# Patient Record
Sex: Male | Born: 1980 | Race: White | Hispanic: Yes | Marital: Single | Smoking: Never smoker
Health system: Southern US, Community
[De-identification: ages and names within clinical notes are randomized; demographics above are authoritative.]

## PROBLEM LIST (undated history)

## (undated) DIAGNOSIS — S82873B Displaced pilon fracture of unspecified tibia, initial encounter for open fracture type I or II: Secondary | ICD-10-CM

---

## 2006-07-28 ENCOUNTER — Emergency Department (HOSPITAL_COMMUNITY): Admission: EM | Admit: 2006-07-28 | Discharge: 2006-07-29 | Payer: Self-pay | Admitting: Emergency Medicine

## 2007-07-01 ENCOUNTER — Emergency Department (HOSPITAL_COMMUNITY): Admission: EM | Admit: 2007-07-01 | Discharge: 2007-07-01 | Payer: Self-pay | Admitting: Emergency Medicine

## 2010-07-04 NOTE — Op Note (Signed)
NAMEGERAD, Jermaine Morgan         ACCOUNT NO.:  [id number]   MEDICAL RECORD NO.:  [id number]          PATIENT TYPE:  EMS   LOCATION:  MINO                         FACILITY:  [facility]   PHYSICIAN:  Dionne Ano. Gramig III, M.D.DATE OF BIRTH:  03-Feb-1981   DATE OF PROCEDURE:  DATE OF DISCHARGE:                               OPERATIVE REPORT   PREOPERATIVE DIAGNOSES:  1. Traumatic amputation, right middle finger secondary to lawn mower      injury.  2. Ring finger laceration.   POSTOPERATIVE DIAGNOSES:  1. Traumatic amputation, right middle finger secondary lawn mower      injury.  2. Ring finger laceration.   PROCEDURE:  1. Incision and drainage, skin and subcutaneous tissue, right ring      finger secondary to lawn mower injury.  2. Incision and drainage,  right middle finger open amputation.  3. Nail plate removal, right middle finger.  4. Nail bed repair, right middle finger.  5. Volar-flap advancement, right middle finger with open treatment of      distal phalanx fracture.   SURGEON:  Dionne Ano. Amanda Pea, M.D.   ASSISTANT:  Karie Chimera, PA-C   TOURNIQUET TIME:  Zero.   ESTIMATED BLOOD LOSS:  Less than 20 mL.   ANESTHESIA:  Intermetacarpal block.   INDICATIONS FOR THE PROCEDURE:  This patient's history is well detailed.  He understands risks and benefits and desires to proceed with surgical  intervention.   OPERATIVE PROCEDURE:  The patient was seen by myself and ER staff at  course.  He was taken to the procedural area and operative arena was  secured.  I performed a thorough prep and drape with Betadine scrub and  paint.  Sensorcaine block was used for anesthesia and formed  intermetacarpal block.  Following this, the patient underwent I and D of  the ring finger.  This was an excisional debridement of skin and  subcutaneous tissue.  This was treated with aggressive I and D.  Bone  was not encroached upon and was stable.   Following this, I and D of skin,  subcutaneous tissue, bone, nail plate,  nail bed was accomplished.   Following this, the patient then underwent nail plate removal.   Following this, the volar flap underwent I and D.  Next, open treatment  of the distal phalanx was accomplished with a rongeur and reassembling  of the bony fragments.  The patient tolerated this well.  Following open  treatment and nail plate removal, I then completed the excisional  debridement and then performed a volar flap advancement.  Skin edges  were cut appropriately and sculpted.  The flap advanced nicely over the  defect in question and was then sewed with combination of 5-0 and 6-0  chromic suture.  He had refitted to the tip with no complicating  features.  Adaptic was placed on the eponychium fold.  I did perform a  complex nail bed repair utilizing the flap against the nail bed tissue.   Once this done, Adaptic was placed in the eponychium fold.  Nonadherent  dressing was then placed followed by finger splint and splinting.  The  patient tolerated this well.  He would be discharged home on Keflex 5 mg  one p.o. q.i.d. x10 days given the open nature of his fracture and the  amputation as well as Vicodin p.r.n. pain, elevation, keeping the area  clean and dry, and RTC to the office in 10 days.  I have discussed him  relevant do's and do not's, etc. and all questions have been encouraged  and answered.  It was a pleasure to see Jermaine Morgan today.  All  questions have been encouraged and answered.           ______________________________  Dionne Ano. Everlene Other, M.D.     Nash Mantis  D:  07/01/2007  T:  07/02/2007  Job:  [id number]

## 2010-07-04 NOTE — H&P (Signed)
Jermaine Morgan, Jermaine Morgan         ACCOUNT NO.:  [id number]   MEDICAL RECORD NO.:  [id number]          PATIENT TYPE:  EMS   LOCATION:  MINO                         FACILITY:  [facility]   PHYSICIAN:  Dionne Ano. Gramig III, M.D.DATE OF BIRTH:  04/24/1980   DATE OF ADMISSION:  07/01/2007  DATE OF DISCHARGE:                              HISTORY & PHYSICAL   HISTORY OF PRESENT ILLNESS:  Rosser is a 30 year old Hispanic male who  speaks minimal English, who presents after sticking his hand under a  [employer]vey[employer], mining. The patient sustained a traumatic amputation and will be  seen in the emergency room and stabilized.  I was asked to see him and  take over his care in regards to his upper extremity.  At the present  time, he does have pain in the finger.  He has no history of prior  injury to the area.  He is here today with his wife, who is pregnant  with their second child.  The patient now discussed all issues in  detail.   PAST MEDICAL HISTORY:  None.   PAST SURGICAL HISTORY:  None.   MEDICATIONS:  None.   ALLERGIES:  None.   SOCIAL HISTORY:  He is married.  He does not smoke or drink.   On examination, he is a pleasant male, alert and oriented, in no acute  distress, right-hand dominant.  He has a right middle finger DIP level  amputation.  There is no evidence of obvious infection.  There is dirty  contamination in the area.  The ring finger has a small skin injury  involving the subcu, but not frank exposed bone.  The middle finger  unfortunately does have exposed bone at the level of the amputation  site.   X-rays reviewed, which showed the amputation about the distal phalanx.  This does not encroached into the interphalangeal region.   IMPRESSION:  1. Open traumatic amputation with distal phalanx fracture, status post      lawn mower injury of right middle finger.  2. Ring finger laceration soft tissue in nature.   PLAN:  I have discussed with him his findings and we will prepare  for  surgical intervention.  He understands risks and benefits of bleeding,  infection, anesthesia, damage to normal structures, and failure of the  surgery to accomplish, its intended goals of relieving symptoms and  restoring function.  With all issues in mind, we will proceed  accordingly.           ______________________________  Dionne Ano. Everlene Other, M.D.    Nash Mantis  D:  07/01/2007  T:  07/02/2007  Job:  [id number]

## 2010-07-04 NOTE — Consult Note (Signed)
NAME:  Jermaine Morgan, Jermaine Morgan NO.:  [id number]   MEDICAL RECORD NO.:  [id number]          PATIENT TYPE:  EMS   LOCATION:  ED                           FACILITY:  [facility]   PHYSICIAN:  Sandria Bales. Ezzard Standing, M.D.  DATE OF BIRTH:  [birth year]   DATE OF CONSULTATION:  07/29/2006  DATE OF DISCHARGE:                                 CONSULTATION   REASON FOR CONSULTATION:  Perirectal abscess.   HISTORY OF ILLNESS:  This is a 30 year old [place] male who speaks  minimal English, who has presented with a history of  pimples around  his anus.  He said it has been going on for 2 or 3 weeks and I was  called at about 1 a.m. on Monday morning, June 9, by Dr. Weldon Inches for  evaluation of his perianal/perirectal abscess.   He has had no prior incision and drainage.  I used the telephone  interpreter during the entire encounter, both for the history and during  the procedure.   PAST MEDICAL HISTORY:  He denies a history of pulmonary disease, cardiac  disease, gastrointestinal disease, or urologic disease.   He works [employer] representative and, again, is from [place].   PHYSICAL EXAMINATION:  VITAL SIGNS:  His temperature is 98.1, blood  pressure 139/81, pulse is 74, respirations 20.  GENERAL:  He is a well-nourished Hispanic male.  LUNGS:  Clear to auscultation.  CARDIAC:  His heart has a regular rate and rhythm without murmur or rub.  ABDOMEN:  Soft.  RECTAL:  He has a raised area on his left lateral rectum, which is about  3-4 cm long and about 1.5 cm wide, consistent with a perianal abscess.   IMPRESSION:  Perianal abscess.   We will plan an incision and drainage in the emergency room.  I  discussed this with the patient via the translator.      Sandria Bales. Ezzard Standing, M.D.  Electronically Signed     DHN/MEDQ  D:  07/29/2006  T:  07/29/2006  Job:  [id number]

## 2010-07-04 NOTE — Op Note (Signed)
NAME:  Jermaine Morgan, Jermaine Morgan NO.:  [id number]   MEDICAL RECORD NO.:  [id number]          PATIENT TYPE:  EMS   LOCATION:  ED                           FACILITY:  [facility]   PHYSICIAN:  Sandria Bales. Ezzard Standing, M.D.  DATE OF BIRTH:  11-16-1980   DATE OF PROCEDURE:  07/29/2006  DATE OF DISCHARGE:                               OPERATIVE REPORT   PREOPERATIVE DIAGNOSIS:  Left lateral perineal abscess.   POSTOPERATIVE DIAGNOSIS:  Left lateral perineal abscess.   PROCEDURE:  Incision and drainage of perineal abscess.   SURGEON:  Sandria Bales. Ezzard Standing, M.D.   ANESTHESIA:  Approximately 8 mL  of 1% Xylocaine.   COMPLICATIONS:  None.   INDICATIONS FOR PROCEDURE:  [name] is a 30 year old [place] male who  has had a two to three week history of pimples around his anus, and  comes to the emergency room with increasing perianal pain.  While in the  emergency room, I got the translator phone to get a history from him;  and then had the translator phone help him during this procedure.   DESCRIPTION OF PROCEDURE:  The perianal area was prepped with Betadine  solution, infiltrated with 8 mL of 1% Xylocaine.  I then made a cruciate  incision into this abscess, drained 2 or 3 mL of pus.   I then packed it with gauze and left instructions with him to soak in a  bathtub three times a day.  He can return to work in a day or two.  He  is to see me back in 10 to 14 days for followup; knows to call me if any  problem.  He was afebrile. There was localized inflammation, but  certainly nothing widespread.  I do not think that he needs to be on  antibiotics at this time.      Sandria Bales. Ezzard Standing, M.D.  Electronically Signed     DHN/MEDQ  D:  07/29/2006  T:  07/29/2006  Job:  [id number]

## 2011-02-23 ENCOUNTER — Encounter: Payer: Self-pay | Admitting: Emergency Medicine

## 2011-02-23 ENCOUNTER — Emergency Department (HOSPITAL_COMMUNITY)
Admission: EM | Admit: 2011-02-23 | Discharge: 2011-02-24 | Disposition: A | Discharge status: Home / Self Care | Payer: Self-pay | Attending: Emergency Medicine | Admitting: Emergency Medicine

## 2011-02-23 DIAGNOSIS — R51 Headache: Secondary | ICD-10-CM | POA: Insufficient documentation

## 2011-02-23 DIAGNOSIS — M2550 Pain in unspecified joint: Secondary | ICD-10-CM | POA: Insufficient documentation

## 2011-02-23 DIAGNOSIS — R232 Flushing: Secondary | ICD-10-CM | POA: Insufficient documentation

## 2011-02-23 DIAGNOSIS — R112 Nausea with vomiting, unspecified: Secondary | ICD-10-CM | POA: Insufficient documentation

## 2011-02-23 DIAGNOSIS — R059 Cough, unspecified: Secondary | ICD-10-CM | POA: Insufficient documentation

## 2011-02-23 DIAGNOSIS — R05 Cough: Secondary | ICD-10-CM | POA: Insufficient documentation

## 2011-02-23 DIAGNOSIS — R000 Tachycardia, unspecified: Secondary | ICD-10-CM | POA: Insufficient documentation

## 2011-02-23 DIAGNOSIS — R61 Generalized hyperhidrosis: Secondary | ICD-10-CM | POA: Insufficient documentation

## 2011-02-23 DIAGNOSIS — R509 Fever, unspecified: Secondary | ICD-10-CM | POA: Insufficient documentation

## 2011-02-23 DIAGNOSIS — J111 Influenza due to unidentified influenza virus with other respiratory manifestations: Secondary | ICD-10-CM | POA: Insufficient documentation

## 2011-02-23 DIAGNOSIS — IMO0001 Reserved for inherently not codable concepts without codable children: Secondary | ICD-10-CM | POA: Insufficient documentation

## 2011-02-23 MED ORDER — ACETAMINOPHEN 325 MG PO TABS
650.0000 mg | ORAL_TABLET | Freq: Once | ORAL | Status: AC
  Administered 2011-02-23: 650 mg via ORAL
  Filled 2011-02-23: qty 2

## 2011-02-23 MED ORDER — SODIUM CHLORIDE 0.9 % IV BOLUS (SEPSIS)
1000.0000 mL | Freq: Once | INTRAVENOUS | Status: AC
  Administered 2011-02-23: 1000 mL via INTRAVENOUS

## 2011-02-23 MED ORDER — ONDANSETRON HCL 4 MG/2ML IJ SOLN
4.0000 mg | Freq: Once | INTRAMUSCULAR | Status: AC
  Administered 2011-02-23: 4 mg via INTRAVENOUS
  Filled 2011-02-23: qty 2

## 2011-02-23 NOTE — ED Notes (Signed)
Pt c/o elevated temp with nausea and vomiting x's 4 days.  Also c/o upper abd pain.

## 2011-02-24 MED ORDER — ONDANSETRON HCL 4 MG PO TABS: 4.0000 mg | ORAL_TABLET | Freq: Four times a day (QID) | ORAL | Status: AC

## 2011-02-24 MED ORDER — IBUPROFEN 800 MG PO TABS
800.0000 mg | ORAL_TABLET | Freq: Once | ORAL | Status: AC
  Administered 2011-02-24: 800 mg via ORAL
  Filled 2011-02-24: qty 1

## 2011-02-24 NOTE — ED Provider Notes (Signed)
Medical screening examination/treatment/procedure(s) were performed by non-physician practitioner and as supervising physician I was immediately available for consultation/collaboration.  Jadzia Ibsen R. Evangelyn Crouse, MD 02/24/11 0745 

## 2011-02-24 NOTE — ED Provider Notes (Signed)
History     CSN: [id number]  Arrival date & time 02/23/11  1842   First MD Initiated Contact with Patient 02/23/11 2229      Chief Complaint  Patient presents with  . Emesis    (Consider location/radiation/quality/duration/timing/severity/associated sxs/prior treatment) HPI Comments: Patient here with four-day history of fever chills, cough, headache, nausea, vomiting x2 today. Denies recent sick contacts. Reports has not gotten flu vaccine.  Patient is a 31 y.o. male presenting with vomiting. The history is provided by the patient and the spouse. No language interpreter was used.  Emesis  This is a new problem. The current episode started more than 2 days ago. The problem occurs 2 to 4 times per day. The problem has not changed since onset.The emesis has an appearance of stomach contents. The maximum temperature recorded prior to his arrival was 103 to 104 F. The fever has been present for 1 to 2 days. Associated symptoms include arthralgias, chills, cough, a fever, headaches, myalgias, sweats and URI. Pertinent negatives include no abdominal pain and no diarrhea.    History reviewed. No pertinent past medical history.  History reviewed. No pertinent past surgical history.  No family history on file.  History  Substance Use Topics  . Smoking status: Never Smoker   . Smokeless tobacco: Not on file  . Alcohol Use: No      Review of Systems  Constitutional: Positive for fever and chills.  Respiratory: Positive for cough.   Gastrointestinal: Positive for vomiting. Negative for abdominal pain and diarrhea.  Musculoskeletal: Positive for myalgias and arthralgias.  Neurological: Positive for headaches.  All other systems reviewed and are negative.    Allergies  Review of patient's allergies indicates no known allergies.  Home Medications   Current Outpatient Rx  Name Route Sig Dispense Refill  . DM-GUAIFENESIN ER 30-600 MG PO TB12 Oral Take 1 tablet by mouth every 12  (twelve) hours.      Doreatha Martin D COLD/FLU PO Oral Take 1 tablet by mouth every 12 (twelve) hours as needed. For cold & flu symptoms       BP 111/59  Pulse 90  Temp(Src) 99.3 F (37.4 C) (Oral)  Resp 20  SpO2 98%  Physical Exam  Nursing note and vitals reviewed. Constitutional: He is oriented to person, place, and time. He appears well-developed and well-nourished. No distress.  HENT:  Head: Normocephalic and atraumatic.  Right Ear: External ear normal.  Left Ear: External ear normal.  Nose: Nose normal.  Mouth/Throat: Oropharynx is clear and moist. No oropharyngeal exudate.  Eyes: Conjunctivae are normal. Pupils are equal, round, and reactive to light.  Neck: Normal range of motion. Neck supple.  Cardiovascular: Regular rhythm and normal heart sounds.  Exam reveals no gallop and no friction rub.   No murmur heard.      Tachycardia  Pulmonary/Chest: Effort normal and breath sounds normal. No respiratory distress. He exhibits no tenderness.  Abdominal: Soft. Bowel sounds are normal. He exhibits no distension. There is no tenderness.  Musculoskeletal: Normal range of motion.  Lymphadenopathy:    He has no cervical adenopathy.  Neurological: He is alert and oriented to person, place, and time. No cranial nerve deficit.  Skin: Skin is warm and dry.       Flushed  Psychiatric: He has a normal mood and affect. His behavior is normal. Judgment and thought content normal.    ED Course  Procedures (including critical care time)  Labs Reviewed - No data to display  No results found.   Influenza   MDM  Patient here with 4 days of influenza-like symptoms. Given Tylenol and Motrin here the fever now down to 99.6. Reports feeling better. Given 1 L of fluids heart rate below 100 now.        Izola Price [place], [place] 02/24/11 [phone]

## 2014-12-17 ENCOUNTER — Encounter (HOSPITAL_COMMUNITY)
Admission: EM | Disposition: A | Discharge status: Home / Self Care | Payer: Self-pay | Source: Home / Self Care | Attending: Orthopaedic Surgery

## 2014-12-17 ENCOUNTER — Emergency Department (HOSPITAL_COMMUNITY): Payer: Worker's Compensation

## 2014-12-17 ENCOUNTER — Emergency Department (HOSPITAL_COMMUNITY): Payer: Worker's Compensation | Admitting: Anesthesiology

## 2014-12-17 ENCOUNTER — Inpatient Hospital Stay (HOSPITAL_COMMUNITY)
Admission: EM | Admit: 2014-12-17 | Discharge: 2014-12-21 | DRG: 494 | Disposition: A | Discharge status: Home / Self Care | Payer: Worker's Compensation | Attending: Orthopaedic Surgery | Admitting: Orthopaedic Surgery

## 2014-12-17 ENCOUNTER — Encounter (HOSPITAL_COMMUNITY): Payer: Self-pay | Admitting: Emergency Medicine

## 2014-12-17 DIAGNOSIS — W19XXXA Unspecified fall, initial encounter: Secondary | ICD-10-CM

## 2014-12-17 DIAGNOSIS — T148XXA Other injury of unspecified body region, initial encounter: Secondary | ICD-10-CM

## 2014-12-17 DIAGNOSIS — T148 Other injury of unspecified body region: Secondary | ICD-10-CM | POA: Diagnosis present

## 2014-12-17 DIAGNOSIS — S82873B Displaced pilon fracture of unspecified tibia, initial encounter for open fracture type I or II: Secondary | ICD-10-CM | POA: Diagnosis present

## 2014-12-17 DIAGNOSIS — S82871B Displaced pilon fracture of right tibia, initial encounter for open fracture type I or II: Principal | ICD-10-CM | POA: Diagnosis present

## 2014-12-17 DIAGNOSIS — S82899A Other fracture of unspecified lower leg, initial encounter for closed fracture: Secondary | ICD-10-CM

## 2014-12-17 DIAGNOSIS — W132XXA Fall from, out of or through roof, initial encounter: Secondary | ICD-10-CM | POA: Diagnosis present

## 2014-12-17 HISTORY — PX: TIBIA IM NAIL INSERTION: SHX2516

## 2014-12-17 LAB — COMPREHENSIVE METABOLIC PANEL
ALBUMIN: 3.7 g/dL (ref 3.5–5.0)
ALK PHOS: 79 U/L (ref 38–126)
ALT: 40 U/L (ref 17–63)
ANION GAP: 8 (ref 5–15)
AST: 30 U/L (ref 15–41)
BILIRUBIN TOTAL: 1.1 mg/dL (ref 0.3–1.2)
BUN: 14 mg/dL (ref 6–20)
CALCIUM: 8.7 mg/dL — AB (ref 8.9–10.3)
CO2: 25 mmol/L (ref 22–32)
CREATININE: 0.7 mg/dL (ref 0.61–1.24)
Chloride: 101 mmol/L (ref 101–111)
GFR calc Af Amer: >60 mL/min (ref >60)
GFR calc non Af Amer: >60 mL/min (ref >60)
GLUCOSE: 135 mg/dL — AB (ref 65–99)
Potassium: 3.2 mmol/L — ABNORMAL LOW (ref 3.5–5.1)
Sodium: 134 mmol/L — ABNORMAL LOW (ref 135–145)
TOTAL PROTEIN: 7.1 g/dL (ref 6.5–8.1)

## 2014-12-17 LAB — PROTIME-INR
INR: 1.06 (ref 0.00–1.49)
PROTHROMBIN TIME: 14 s (ref 11.6–15.2)

## 2014-12-17 LAB — TYPE AND SCREEN
ABO/RH(D): O POS
ANTIBODY SCREEN: NEGATIVE

## 2014-12-17 LAB — CBC
HCT: 42.7 % (ref 39.0–52.0)
HEMOGLOBIN: 13.7 g/dL (ref 13.0–17.0)
MCH: 26.3 pg (ref 26.0–34.0)
MCHC: 32.1 g/dL (ref 30.0–36.0)
MCV: 82 fL (ref 78.0–100.0)
PLATELETS: 175 10*3/uL (ref 150–400)
RBC: 5.21 MIL/uL (ref 4.22–5.81)
RDW: 13.2 % (ref 11.5–15.5)
WBC: 5.7 10*3/uL (ref 4.0–10.5)

## 2014-12-17 LAB — CDS SEROLOGY

## 2014-12-17 LAB — I-STAT CHEM 8, ED
BUN: 17 mg/dL (ref 6–20)
CHLORIDE: 101 mmol/L (ref 101–111)
CREATININE: 0.7 mg/dL (ref 0.61–1.24)
Calcium, Ion: 1.1 mmol/L — ABNORMAL LOW (ref 1.12–1.23)
GLUCOSE: 133 mg/dL — AB (ref 65–99)
HCT: 46 % (ref 39.0–52.0)
Hemoglobin: 15.6 g/dL (ref 13.0–17.0)
POTASSIUM: 3.3 mmol/L — AB (ref 3.5–5.1)
Sodium: 140 mmol/L (ref 135–145)
TCO2: 25 mmol/L (ref 0–100)

## 2014-12-17 LAB — ETHANOL

## 2014-12-17 LAB — ABO/RH: ABO/RH(D): O POS

## 2014-12-17 SURGERY — INSERTION, INTRAMEDULLARY ROD, TIBIA
Anesthesia: General | Site: Leg Lower | Laterality: Right

## 2014-12-17 MED ORDER — MIDAZOLAM HCL 5 MG/5ML IJ SOLN
INTRAMUSCULAR | Status: DC | PRN
  Administered 2014-12-17: 2 mg via INTRAVENOUS

## 2014-12-17 MED ORDER — ONDANSETRON HCL 4 MG/2ML IJ SOLN
INTRAMUSCULAR | Status: AC
  Filled 2014-12-17: qty 2

## 2014-12-17 MED ORDER — METOCLOPRAMIDE HCL 5 MG PO TABS: 5.0000 mg | ORAL_TABLET | Freq: Three times a day (TID) | ORAL | Status: DC | PRN

## 2014-12-17 MED ORDER — CEFAZOLIN (ANCEF) 1 G IV SOLR: 2.0000 g | INTRAVENOUS | Status: DC

## 2014-12-17 MED ORDER — OXYCODONE HCL 5 MG PO TABS
ORAL_TABLET | ORAL | Status: AC
  Administered 2014-12-17: 15 mg via ORAL
  Filled 2014-12-17: qty 3

## 2014-12-17 MED ORDER — FENTANYL CITRATE (PF) 100 MCG/2ML IJ SOLN
25.0000 ug | INTRAMUSCULAR | Status: DC | PRN
  Administered 2014-12-17 (×3): 50 ug via INTRAVENOUS

## 2014-12-17 MED ORDER — METOCLOPRAMIDE HCL 5 MG/ML IJ SOLN: 5.0000 mg | Freq: Three times a day (TID) | INTRAMUSCULAR | Status: DC | PRN

## 2014-12-17 MED ORDER — SODIUM CHLORIDE 0.9 % IR SOLN
Status: DC | PRN
  Administered 2014-12-17 (×2): 3000 mL

## 2014-12-17 MED ORDER — NEOSTIGMINE METHYLSULFATE 10 MG/10ML IV SOLN
INTRAVENOUS | Status: AC
  Filled 2014-12-17: qty 1

## 2014-12-17 MED ORDER — ONDANSETRON HCL 4 MG/2ML IJ SOLN: 4.0000 mg | Freq: Four times a day (QID) | INTRAMUSCULAR | Status: DC | PRN

## 2014-12-17 MED ORDER — MORPHINE SULFATE (PF) 2 MG/ML IV SOLN
1.0000 mg | INTRAVENOUS | Status: DC | PRN
  Administered 2014-12-17 – 2014-12-19 (×10): 1 mg via INTRAVENOUS
  Filled 2014-12-17 (×11): qty 1

## 2014-12-17 MED ORDER — FENTANYL CITRATE (PF) 100 MCG/2ML IJ SOLN
INTRAMUSCULAR | Status: AC
  Filled 2014-12-17: qty 2

## 2014-12-17 MED ORDER — SUCCINYLCHOLINE CHLORIDE 20 MG/ML IJ SOLN
INTRAMUSCULAR | Status: DC | PRN
  Administered 2014-12-17: 160 mg via INTRAVENOUS

## 2014-12-17 MED ORDER — FENTANYL CITRATE (PF) 100 MCG/2ML IJ SOLN
INTRAMUSCULAR | Status: AC
  Administered 2014-12-17: 50 ug via INTRAVENOUS
  Filled 2014-12-17: qty 2

## 2014-12-17 MED ORDER — GENTAMICIN SULFATE 40 MG/ML IJ SOLN
5.0000 mg/kg | INTRAVENOUS | Status: AC
  Administered 2014-12-17: 490 mg via INTRAVENOUS
  Filled 2014-12-17: qty 12.25

## 2014-12-17 MED ORDER — FENTANYL CITRATE (PF) 250 MCG/5ML IJ SOLN
INTRAMUSCULAR | Status: AC
  Filled 2014-12-17: qty 5

## 2014-12-17 MED ORDER — MIDAZOLAM HCL 2 MG/2ML IJ SOLN
INTRAMUSCULAR | Status: AC
  Filled 2014-12-17: qty 4

## 2014-12-17 MED ORDER — LIDOCAINE HCL (CARDIAC) 20 MG/ML IV SOLN
INTRAVENOUS | Status: AC
  Filled 2014-12-17: qty 5

## 2014-12-17 MED ORDER — ACETAMINOPHEN 650 MG RE SUPP
650.0000 mg | Freq: Four times a day (QID) | RECTAL | Status: DC | PRN
  Filled 2014-12-17: qty 1

## 2014-12-17 MED ORDER — HYDROMORPHONE HCL 1 MG/ML IJ SOLN
INTRAMUSCULAR | Status: AC
  Administered 2014-12-17 (×4): 0.5 mg via INTRAVENOUS
  Filled 2014-12-17: qty 1

## 2014-12-17 MED ORDER — HYDROMORPHONE HCL 1 MG/ML IJ SOLN
1.0000 mg | Freq: Once | INTRAMUSCULAR | Status: AC
  Administered 2014-12-17: 1 mg via INTRAVENOUS

## 2014-12-17 MED ORDER — METHOCARBAMOL 1000 MG/10ML IJ SOLN
500.0000 mg | Freq: Four times a day (QID) | INTRAVENOUS | Status: DC | PRN
  Administered 2014-12-17: 500 mg via INTRAVENOUS
  Filled 2014-12-17 (×2): qty 5

## 2014-12-17 MED ORDER — HYDROMORPHONE HCL 1 MG/ML IJ SOLN
1.0000 mg | Freq: Once | INTRAMUSCULAR | Status: AC
  Administered 2014-12-17: 1 mg via INTRAVENOUS
  Filled 2014-12-17: qty 1

## 2014-12-17 MED ORDER — CEFAZOLIN SODIUM 1-5 GM-% IV SOLN
1.0000 g | Freq: Once | INTRAVENOUS | Status: AC
  Administered 2014-12-17: 1 g via INTRAVENOUS
  Filled 2014-12-17: qty 50

## 2014-12-17 MED ORDER — OXYCODONE HCL 5 MG PO TABS
5.0000 mg | ORAL_TABLET | ORAL | Status: DC | PRN
  Administered 2014-12-17 – 2014-12-20 (×20): 15 mg via ORAL
  Administered 2014-12-21: 10 mg via ORAL
  Administered 2014-12-21: 15 mg via ORAL
  Filled 2014-12-17 (×8): qty 3
  Filled 2014-12-17: qty 2
  Filled 2014-12-17 (×13): qty 3

## 2014-12-17 MED ORDER — FENTANYL CITRATE (PF) 100 MCG/2ML IJ SOLN
50.0000 ug | Freq: Once | INTRAMUSCULAR | Status: AC
  Administered 2014-12-17: 50 ug via INTRAVENOUS

## 2014-12-17 MED ORDER — PHENYLEPHRINE HCL 10 MG/ML IJ SOLN
INTRAMUSCULAR | Status: DC | PRN
Start: 2014-12-17 — End: 2014-12-17
  Administered 2014-12-17 (×2): 80 ug via INTRAVENOUS

## 2014-12-17 MED ORDER — ACETAMINOPHEN 325 MG PO TABS
650.0000 mg | ORAL_TABLET | Freq: Four times a day (QID) | ORAL | Status: DC | PRN
  Administered 2014-12-17 – 2014-12-20 (×2): 650 mg via ORAL
  Filled 2014-12-17 (×2): qty 2

## 2014-12-17 MED ORDER — SORBITOL 70 % SOLN: 30.0000 mL | Freq: Every day | Status: DC | PRN

## 2014-12-17 MED ORDER — HYDROMORPHONE HCL 1 MG/ML IJ SOLN
INTRAMUSCULAR | Status: AC
  Administered 2014-12-17: 0.25 mg via INTRAVENOUS
  Filled 2014-12-17: qty 1

## 2014-12-17 MED ORDER — DEXAMETHASONE SODIUM PHOSPHATE 4 MG/ML IJ SOLN
INTRAMUSCULAR | Status: DC | PRN
  Administered 2014-12-17: 4 mg via INTRAVENOUS

## 2014-12-17 MED ORDER — HYDROMORPHONE HCL 1 MG/ML IJ SOLN
0.2500 mg | INTRAMUSCULAR | Status: DC | PRN
  Administered 2014-12-17 (×4): 0.25 mg via INTRAVENOUS

## 2014-12-17 MED ORDER — DIPHENHYDRAMINE HCL 12.5 MG/5ML PO ELIX: 25.0000 mg | ORAL_SOLUTION | ORAL | Status: DC | PRN

## 2014-12-17 MED ORDER — HYDROMORPHONE HCL 1 MG/ML IJ SOLN
INTRAMUSCULAR | Status: AC
  Filled 2014-12-17: qty 1

## 2014-12-17 MED ORDER — CEFAZOLIN SODIUM-DEXTROSE 2-3 GM-% IV SOLR
2.0000 g | Freq: Three times a day (TID) | INTRAVENOUS | Status: DC
  Administered 2014-12-17 – 2014-12-21 (×11): 2 g via INTRAVENOUS
  Filled 2014-12-17 (×15): qty 50

## 2014-12-17 MED ORDER — PENICILLIN G POTASSIUM 5000000 UNITS IJ SOLR
2.0000 10*6.[IU] | INTRAVENOUS | Status: AC
  Administered 2014-12-17: 2 10*6.[IU] via INTRAVENOUS
  Filled 2014-12-17: qty 2

## 2014-12-17 MED ORDER — METHOCARBAMOL 500 MG PO TABS
500.0000 mg | ORAL_TABLET | Freq: Four times a day (QID) | ORAL | Status: DC | PRN
  Administered 2014-12-17 – 2014-12-20 (×9): 500 mg via ORAL
  Filled 2014-12-17 (×10): qty 1

## 2014-12-17 MED ORDER — DEXAMETHASONE SODIUM PHOSPHATE 4 MG/ML IJ SOLN
INTRAMUSCULAR | Status: AC
  Filled 2014-12-17: qty 1

## 2014-12-17 MED ORDER — LACTATED RINGERS IV SOLN
INTRAVENOUS | Status: DC
  Administered 2014-12-17 (×2): via INTRAVENOUS

## 2014-12-17 MED ORDER — TETANUS-DIPHTH-ACELL PERTUSSIS 5-2.5-18.5 LF-MCG/0.5 IM SUSP
0.5000 mL | Freq: Once | INTRAMUSCULAR | Status: AC
  Administered 2014-12-17: 0.5 mL via INTRAMUSCULAR
  Filled 2014-12-17: qty 0.5

## 2014-12-17 MED ORDER — LIDOCAINE HCL (CARDIAC) 20 MG/ML IV SOLN
INTRAVENOUS | Status: DC | PRN
  Administered 2014-12-17: 40 mg via INTRAVENOUS

## 2014-12-17 MED ORDER — SUCCINYLCHOLINE CHLORIDE 20 MG/ML IJ SOLN
INTRAMUSCULAR | Status: AC
  Filled 2014-12-17: qty 1

## 2014-12-17 MED ORDER — PROPOFOL 10 MG/ML IV BOLUS
INTRAVENOUS | Status: AC
  Filled 2014-12-17: qty 20

## 2014-12-17 MED ORDER — ROCURONIUM BROMIDE 50 MG/5ML IV SOLN
INTRAVENOUS | Status: AC
  Filled 2014-12-17: qty 1

## 2014-12-17 MED ORDER — MAGNESIUM CITRATE PO SOLN: 1.0000 | Freq: Once | ORAL | Status: DC | PRN

## 2014-12-17 MED ORDER — GLYCOPYRROLATE 0.2 MG/ML IJ SOLN
INTRAMUSCULAR | Status: AC
  Filled 2014-12-17: qty 2

## 2014-12-17 MED ORDER — POLYETHYLENE GLYCOL 3350 17 G PO PACK: 17.0000 g | PACK | Freq: Every day | ORAL | Status: DC | PRN

## 2014-12-17 MED ORDER — ONDANSETRON HCL 4 MG/2ML IJ SOLN
INTRAMUSCULAR | Status: DC | PRN
  Administered 2014-12-17: 8 mg via INTRAVENOUS

## 2014-12-17 MED ORDER — PROPOFOL 10 MG/ML IV BOLUS
INTRAVENOUS | Status: DC | PRN
  Administered 2014-12-17: 200 mg via INTRAVENOUS

## 2014-12-17 MED ORDER — ONDANSETRON HCL 4 MG/2ML IJ SOLN: 4.0000 mg | Freq: Once | INTRAMUSCULAR | Status: DC | PRN

## 2014-12-17 MED ORDER — IOHEXOL 300 MG/ML  SOLN
100.0000 mL | Freq: Once | INTRAMUSCULAR | Status: AC | PRN
Start: 2014-12-17 — End: 2014-12-17
  Administered 2014-12-17: 100 mL via INTRAVENOUS

## 2014-12-17 MED ORDER — FENTANYL CITRATE (PF) 100 MCG/2ML IJ SOLN
INTRAMUSCULAR | Status: DC | PRN
  Administered 2014-12-17 (×7): 50 ug via INTRAVENOUS
  Administered 2014-12-17: 150 ug via INTRAVENOUS

## 2014-12-17 MED ORDER — CEFAZOLIN SODIUM 1-5 GM-% IV SOLN
INTRAVENOUS | Status: AC
  Administered 2014-12-17: 1 g via INTRAVENOUS
  Filled 2014-12-17: qty 50

## 2014-12-17 MED ORDER — ONDANSETRON HCL 4 MG PO TABS: 4.0000 mg | ORAL_TABLET | Freq: Four times a day (QID) | ORAL | Status: DC | PRN

## 2014-12-17 MED ORDER — SODIUM CHLORIDE 0.9 % IV SOLN
INTRAVENOUS | Status: DC
  Administered 2014-12-17 – 2014-12-18 (×2): via INTRAVENOUS

## 2014-12-17 SURGICAL SUPPLY — 46 items
BANDAGE ESMARK 6X9 LF (GAUZE/BANDAGES/DRESSINGS) ×1 IMPLANT
BIT DRILL 200X4XGRY 5XPIN (BIT) IMPLANT
BIT DRL 200X4XGRY 5XPIN (BIT) ×1
BNDG CMPR 9X6 STRL LF SNTH (GAUZE/BANDAGES/DRESSINGS) ×1
BNDG ESMARK 6X9 LF (GAUZE/BANDAGES/DRESSINGS) ×3
BNDG GAUZE ELAST 4 BULKY (GAUZE/BANDAGES/DRESSINGS) ×2 IMPLANT
COVER MAYO STAND STRL (DRAPES) ×3 IMPLANT
COVER SURGICAL LIGHT HANDLE (MISCELLANEOUS) ×3 IMPLANT
CUFF TOURNIQUET SINGLE 34IN LL (TOURNIQUET CUFF) ×3 IMPLANT
DRAPE C-ARM 42X72 X-RAY (DRAPES) ×3 IMPLANT
DRAPE C-ARMOR (DRAPES) ×3 IMPLANT
DRAPE EXTREMITY T 121X128X90 (DRAPE) ×3 IMPLANT
DRAPE IMP U-DRAPE 54X76 (DRAPES) ×3 IMPLANT
DRAPE POUCH INSTRU U-SHP 10X18 (DRAPES) ×3 IMPLANT
DRAPE U-SHAPE 47X51 STRL (DRAPES) ×3 IMPLANT
DRAPE UTILITY XL STRL (DRAPES) ×4 IMPLANT
DRILL BIT 4.0MM (BIT) ×3
ELECT CAUTERY BLADE 6.4 (BLADE) ×3 IMPLANT
ELECT REM PT RETURN 9FT ADLT (ELECTROSURGICAL) ×3
ELECTRODE REM PT RTRN 9FT ADLT (ELECTROSURGICAL) ×1 IMPLANT
GAUZE SPONGE 4X4 12PLY STRL (GAUZE/BANDAGES/DRESSINGS) ×2 IMPLANT
GAUZE XEROFORM 5X9 LF (GAUZE/BANDAGES/DRESSINGS) ×2 IMPLANT
GLOVE BIOGEL PI IND STRL 7.5 (GLOVE) IMPLANT
GLOVE BIOGEL PI INDICATOR 7.5 (GLOVE) ×2
GLOVE ECLIPSE 7.0 STRL STRAW (GLOVE) ×2 IMPLANT
GLOVE NEODERM STRL 7.5 LF PF (GLOVE) ×4 IMPLANT
GLOVE SURG NEODERM 7.5  LF PF (GLOVE) ×2
GLOVE SURG SYN 7.5  E (GLOVE) ×2
GLOVE SURG SYN 7.5 E (GLOVE) ×1 IMPLANT
GLOVE SURG SYN 7.5 PF PI (GLOVE) IMPLANT
GOWN STRL REIN XL XLG (GOWN DISPOSABLE) ×3 IMPLANT
KIT BASIN OR (CUSTOM PROCEDURE TRAY) ×3 IMPLANT
NS IRRIG 1000ML POUR BTL (IV SOLUTION) ×3 IMPLANT
PACK TOTAL JOINT (CUSTOM PROCEDURE TRAY) ×3 IMPLANT
PIN APEX 5X150 (EXFIX) ×4 IMPLANT
PIN TO ROD (PIN) ×8 IMPLANT
PIN TO ROD COUPLING INVERT (EXFIX) ×4 IMPLANT
PIN TRANSFIX 615X300 EXFIX (EXFIX) ×2 IMPLANT
ROD CARBON (EXFIX) ×6
ROD CNCT 400X11XNS LF HFMN (EXFIX) IMPLANT
ROD HOFFMANN3 CONNECT 11X150 (EXFIX) ×4 IMPLANT
ROD SEMI CIRCULAR EXFIX (EXFIX) ×2 IMPLANT
ROD TO ROD COUPLING EXFIX (EXFIX) ×8 IMPLANT
SET CYSTO W/LG BORE CLAMP LF (SET/KITS/TRAYS/PACK) ×2 IMPLANT
SUT ETHILON 3 0 PS 1 (SUTURE) ×4 IMPLANT
TOWEL OR 17X26 10 PK STRL BLUE (TOWEL DISPOSABLE) ×4 IMPLANT

## 2014-12-17 NOTE — H&P (Signed)
ORTHOPAEDIC HISTORY AND PHYSICAL   Chief Complaint: Fall  HPI: Silvano Garofano is a 34 y.o. male who presents with right open ankle injury s/p 15 ft fall from roof onto ground with obvious deformity of right ankle earlier today.  Unable to weight bear.  Pain is severe, grinding, does not radiate, worse with any movement of foot.  Immobilization helps the pain.  Ortho consulted to evaluate.  History reviewed. No pertinent past medical history. History reviewed. No pertinent past surgical history. Social History   Social History  . Marital Status: Single    Spouse Name: N/A  . Number of Children: N/A  . Years of Education: N/A   Social History Main Topics  . Smoking status: Never Smoker   . Smokeless tobacco: Never Used  . Alcohol Use: No  . Drug Use: No  . Sexual Activity: Not Asked   Other Topics Concern  . None   Social History Narrative   No family history on file. No Known Allergies Prior to Admission medications   Not on File   Dg Knee 2 Views Right  12/17/2014  CLINICAL DATA:  Fall with right knee pain.  Initial encounter. EXAM: RIGHT KNEE - 1-2 VIEW COMPARISON:  None. FINDINGS: There is no evidence of fracture, dislocation, or joint effusion. There is no evidence of arthropathy or other focal bone abnormality. Soft tissues are unremarkable. IMPRESSION: Negative. Electronically Signed   By: Irish Lack M.D.   On: 12/17/2014 11:54   Dg Tibia/fibula Right  12/17/2014  CLINICAL DATA:  Fall off of roof with comminuted fractures of the distal tibia and fibula. Subsequent encounter. EXAM: RIGHT TIBIA AND FIBULA - 2 VIEW COMPARISON:  X-rays and CT from earlier today. FINDINGS: Degree displacement of the distal fibular and tibial fractures appears slightly improved. The tibial fracture again appears to be an open fracture. IMPRESSION: Slightly improved alignment at the level of right distal fibular and tibial fractures. Electronically Signed   By: Irish Lack  M.D.   On: 12/17/2014 11:59   Ct Head Wo Contrast  12/17/2014  CLINICAL DATA:  34 year old male with head and cervical spine injury following 15 foot fall. Initial encounter. EXAM: CT HEAD WITHOUT CONTRAST CT CERVICAL SPINE WITHOUT CONTRAST TECHNIQUE: Multidetector CT imaging of the head and cervical spine was performed following the standard protocol without intravenous contrast. Multiplanar CT image reconstructions of the cervical spine were also generated. COMPARISON:  None. FINDINGS: CT HEAD FINDINGS No intracranial abnormalities are identified, including mass lesion or mass effect, hydrocephalus, extra-axial fluid collection, midline shift, hemorrhage, or acute infarction. The visualized bony calvarium is unremarkable. CT CERVICAL SPINE FINDINGS Normal alignment is noted. There is no evidence of acute fracture, subluxation or prevertebral soft tissue swelling. The disc spaces are maintained. No focal bony lesions are identified. The soft tissue structures and lung apices are unremarkable. IMPRESSION: Unremarkable CT of the head and cervical spine. Electronically Signed   By: Harmon Pier M.D.   On: 12/17/2014 11:12   Ct Chest W Contrast  12/17/2014  CLINICAL DATA:  34 year old male status post fall from 15 feet with neck and back pain. EXAM: CT CHEST, ABDOMEN, AND PELVIS WITH CONTRAST TECHNIQUE: Multidetector CT imaging of the chest, abdomen and pelvis was performed following the standard protocol during bolus administration of intravenous contrast. CONTRAST:  OMNIPAQUE IOHEXOL 300 MG/ML  SOLN COMPARISON:  Concurrently obtained CT scans of the head and cervical spine FINDINGS: CT CHEST FINDINGS Mediastinum/Nodes: Unremarkable CT appearance of the thyroid gland.  No suspicious mediastinal or hilar adenopathy. No soft tissue mediastinal mass. The thoracic esophagus is unremarkable. Lungs/Pleura: Conventional 3 vessel arch anatomy. No evidence of traumatic dissection. The heart is within normal limits  for size. No pericardial effusion. Unremarkable main pulmonary vein. Musculoskeletal: No acute fracture or aggressive appearing lytic or blastic osseous lesion. CT ABDOMEN PELVIS FINDINGS Hepatobiliary: Normal hepatic contour morphology. No evidence of discrete lesion or injury. The portal veins are widely patent. Gallbladder is unremarkable. No intra or extrahepatic biliary ductal dilatation. Pancreas: Unremarkable.  No evidence of injury or mass lesion. Spleen: Normal appearance.  No evidence of acute injury. Adrenals/Urinary Tract: The bilateral adrenal glands are normal. No evidence of hemorrhage or nodule. Both kidneys are normal in appearance. No evidence of nephrolithiasis, hydronephrosis or acute injury. Stomach/Bowel: No evidence of obstruction or focal bowel wall thickening. Normal appendix in the right lower quadrant. The terminal ileum is unremarkable. Unremarkable CT appearance of the stomach and duodenum. Vascular/Lymphatic: No significant atherosclerotic vascular disease, aneurysmal dilatation or acute abnormality. No suspicious adenopathy or evidence of free fluid. Reproductive: Unremarkable bladder, prostate gland and seminal vesicles. No free fluid or suspicious adenopathy. Other: None Musculoskeletal: No acute fracture or aggressive appearing lytic or blastic osseous lesion. IMPRESSION: CT CHEST 1. No evidence of acute injury to the thorax. 2. Unremarkable CT of the chest. CT ABD/PELVIS 1. No evidence of acute injury involving the abdomen or pelvis. 2. Unremarkable CT scan of the abdomen and pelvis. Electronically Signed   By: Malachy MoanHeath  McCullough M.D.   On: 12/17/2014 11:18   Ct Cervical Spine Wo Contrast  12/17/2014  CLINICAL DATA:  34 year old male with head and cervical spine injury following 15 foot fall. Initial encounter. EXAM: CT HEAD WITHOUT CONTRAST CT CERVICAL SPINE WITHOUT CONTRAST TECHNIQUE: Multidetector CT imaging of the head and cervical spine was performed following the standard  protocol without intravenous contrast. Multiplanar CT image reconstructions of the cervical spine were also generated. COMPARISON:  None. FINDINGS: CT HEAD FINDINGS No intracranial abnormalities are identified, including mass lesion or mass effect, hydrocephalus, extra-axial fluid collection, midline shift, hemorrhage, or acute infarction. The visualized bony calvarium is unremarkable. CT CERVICAL SPINE FINDINGS Normal alignment is noted. There is no evidence of acute fracture, subluxation or prevertebral soft tissue swelling. The disc spaces are maintained. No focal bony lesions are identified. The soft tissue structures and lung apices are unremarkable. IMPRESSION: Unremarkable CT of the head and cervical spine. Electronically Signed   By: Harmon PierJeffrey  Hu M.D.   On: 12/17/2014 11:12   Ct Abdomen Pelvis W Contrast  12/17/2014  CLINICAL DATA:  34 year old male status post fall from 15 feet with neck and back pain. EXAM: CT CHEST, ABDOMEN, AND PELVIS WITH CONTRAST TECHNIQUE: Multidetector CT imaging of the chest, abdomen and pelvis was performed following the standard protocol during bolus administration of intravenous contrast. CONTRAST:  100mL OMNIPAQUE IOHEXOL 300 MG/ML  SOLN COMPARISON:  Concurrently obtained CT scans of the head and cervical spine FINDINGS: CT CHEST FINDINGS Mediastinum/Nodes: Unremarkable CT appearance of the thyroid gland. No suspicious mediastinal or hilar adenopathy. No soft tissue mediastinal mass. The thoracic esophagus is unremarkable. Lungs/Pleura: Conventional 3 vessel arch anatomy. No evidence of traumatic dissection. The heart is within normal limits for size. No pericardial effusion. Unremarkable main pulmonary vein. Musculoskeletal: No acute fracture or aggressive appearing lytic or blastic osseous lesion. CT ABDOMEN PELVIS FINDINGS Hepatobiliary: Normal hepatic contour morphology. No evidence of discrete lesion or injury. The portal veins are widely patent. Gallbladder is  unremarkable.  No intra or extrahepatic biliary ductal dilatation. Pancreas: Unremarkable.  No evidence of injury or mass lesion. Spleen: Normal appearance.  No evidence of acute injury. Adrenals/Urinary Tract: The bilateral adrenal glands are normal. No evidence of hemorrhage or nodule. Both kidneys are normal in appearance. No evidence of nephrolithiasis, hydronephrosis or acute injury. Stomach/Bowel: No evidence of obstruction or focal bowel wall thickening. Normal appendix in the right lower quadrant. The terminal ileum is unremarkable. Unremarkable CT appearance of the stomach and duodenum. Vascular/Lymphatic: No significant atherosclerotic vascular disease, aneurysmal dilatation or acute abnormality. No suspicious adenopathy or evidence of free fluid. Reproductive: Unremarkable bladder, prostate gland and seminal vesicles. No free fluid or suspicious adenopathy. Other: None Musculoskeletal: No acute fracture or aggressive appearing lytic or blastic osseous lesion. IMPRESSION: CT CHEST 1. No evidence of acute injury to the thorax. 2. Unremarkable CT of the chest. CT ABD/PELVIS 1. No evidence of acute injury involving the abdomen or pelvis. 2. Unremarkable CT scan of the abdomen and pelvis. Electronically Signed   By: Malachy Moan M.D.   On: 12/17/2014 11:18   Ct Ankle Right Wo Contrast  12/17/2014  CLINICAL DATA:  Status post fall from 15 feet. EXAM: CT OF THE RIGHT ANKLE WITHOUT CONTRAST TECHNIQUE: Multidetector CT imaging of the right ankle was performed according to the standard protocol. Multiplanar CT image reconstructions were also generated. COMPARISON:  None. FINDINGS: There is a comminuted fracture of the distal tibial diametaphysis with 1.8 cm of lateral displacement, 1.4 cm of anterior displacement and 2 cm of overriding between the major fracture fragments along the medial aspect. There is a fracture cleft involving the anterior lip of the tibia. There is comminution of the lateral portion  of the articular surface of the tibial plafond with 1-2 mm of depression. There is a open wound adjacent to the medial malleolus with soft tissue emphysema around the fracture site consistent with an open fracture. There is a nondisplaced fracture involving the medial malleolus. The distal tibiofibular joint is congruent. The ankle mortise is intact. There is a comminuted fracture of the distal fibular diaphysis with mild angulation of the major fracture fragment between the proximal and distal shaft. There is a fracture cleft partially visualized extending into the mid fibular diaphysis which is incompletely included in the field of view. Normal subtalar joints. No other fracture or dislocation. Mild osteoarthritis of the navicular-middle cuneiform. IMPRESSION: 1. Severely comminuted and displaced fracture of the distal tibial diametaphysis as detailed above. Fracture cleft involves the anterior lip of the tibia. Air seen around the fracture site with soft tissue wound over the medial malleolus consistent with an open fracture. 2. Comminution of the lateral portion of the articular surface of the tibial plafond with 1-2 mm of depression. 3. Nondisplaced fracture of the medial malleolus. 4. Comminuted fracture of the distal fibular diaphysis with mild angulation of the fracture fragment situated between the proximal and distal shafts. fracture cleft partially visualized extending into the mid fibular diaphysis which is incompletely included in the field of view. Electronically Signed   By: Elige Ko   On: 12/17/2014 11:17   Dg Pelvis Portable  12/17/2014  CLINICAL DATA:  Larey Seat off roof wall working. EXAM: PORTABLE PELVIS 1-2 VIEWS COMPARISON:  None. FINDINGS: There is no evidence of pelvic fracture or diastasis. No pelvic bone lesions are seen. IMPRESSION: Normal pelvis. Electronically Signed   By: Lupita Raider, M.D.   On: 12/17/2014 09:48   Dg Chest Portable 1 View  12/17/2014  CLINICAL DATA:  Larey Seat off  roof while working. EXAM: PORTABLE CHEST 1 VIEW COMPARISON:  None. FINDINGS: The heart size and mediastinal contours are within normal limits. Both lungs are clear. No pneumothorax or pleural effusion is noted. The visualized skeletal structures are unremarkable. IMPRESSION: No acute cardiopulmonary abnormality seen. Electronically Signed   By: Lupita Raider, M.D.   On: 12/17/2014 09:46   Dg Ankle Right Port  12/17/2014  CLINICAL DATA:  Open ankle fracture from falling off bruit while working. EXAM: PORTABLE RIGHT ANKLE - 2 VIEW COMPARISON:  None. FINDINGS: Examination demonstrates a displaced slightly comminuted fracture of the distal tibial diametaphyseal region extending to the articular surface. There is approximately 1 shaft's with of medial displacement and 1/2 shaft's with posterior displacement of the proximal fragment. There is a comminuted displaced fracture of the distal fibular diaphysis approximately 4 cm above the ankle mortise. Linear sclerosis over the posterior plantar aspect of the calcaneus as cannot exclude a subtle impaction fracture. IMPRESSION: Moderately displaced and minimally comminuted fracture of the distal tibial diametaphyseal region with extension to the articular surface. Displaced comminuted fracture of the distal fibular diaphysis. Possible nondisplaced impaction fracture of the posterior calcaneus. Electronically Signed   By: Elberta Fortis M.D.   On: 12/17/2014 09:55   Dg Femur, Min 2 Views Right  12/17/2014  CLINICAL DATA:  Patient status post fall from roof. Initial encounter. EXAM: RIGHT FEMUR 2 VIEWS COMPARISON:  None. FINDINGS: Two AP images of the femur were submitted. Additionally there is a lateral image of the distal femur. The entire femur is not visualized on lateral image. No evidence for displaced fracture. IMPRESSION: Limited exam as the entire femur was not able to be visualized in two views. No evidence for displaced fracture. Consider followup radiograph  when patient clinically able to assess the entire femur in two views. Electronically Signed   By: Annia Belt M.D.   On: 12/17/2014 11:57    Positive ROS: All other systems have been reviewed and were otherwise negative with the exception of those mentioned in the HPI and as above.  Physical Exam: General: Alert, no acute distress Cardiovascular: No pedal edema Respiratory: No cyanosis, no use of accessory musculature GI: No organomegaly, abdomen is soft and non-tender Skin: No lesions in the area of chief complaint Neurologic: Sensation intact distally Psychiatric: Patient is competent for consent with normal mood and affect Lymphatic: No axillary or cervical lymphadenopathy  MUSCULOSKELETAL:  - open wound on medial aspect of distal tibia without gross contamination - foot NVI - strong pulses - compartments soft  Assessment: Right open pilon fracture  Plan: - NPO - urgent I&D and ex fix - trauma work up neg, admit to ortho - consent obtained   N. Glee Arvin, MD [facility] [phone] 12:29 PM

## 2014-12-17 NOTE — Anesthesia Procedure Notes (Signed)
Procedure Name: Intubation Date/Time: 12/17/2014 1:28 PM Performed by: Lovie CholOCK, Raidyn Wassink K Pre-anesthesia Checklist: Patient identified, Emergency Drugs available, Suction available, Patient being monitored and Timeout performed Patient Re-evaluated:Patient Re-evaluated prior to inductionOxygen Delivery Method: Circle system utilized Preoxygenation: Pre-oxygenation with 100% oxygen Intubation Type: IV induction, Rapid sequence and Cricoid Pressure applied Grade View: Grade I Tube type: Oral Tube size: 7.5 mm Number of attempts: 1 Airway Equipment and Method: Stylet and Video-laryngoscopy Placement Confirmation: ETT inserted through vocal cords under direct vision,  positive ETCO2,  CO2 detector and breath sounds checked- equal and bilateral Secured at: 21 cm Tube secured with: Tape Dental Injury: Teeth and Oropharynx as per pre-operative assessment  Difficulty Due To: Difficult Airway- due to cervical collar and Difficult Airway- due to limited oral opening Comments: Elective video-glide. Patient in c-collar and c-spine not cleared yet.

## 2014-12-17 NOTE — Progress Notes (Signed)
Dr. Delrae AlfredM. Judd made aware of pain, will monitor.

## 2014-12-17 NOTE — ED Notes (Signed)
Per EMS, pt coming in from work with a fall from 15 feet onto dirt. Pt reports LOC, pt has open right ankle fracture, pt also reports neck and back pain. Pt given 100 mcg of fentanyl

## 2014-12-17 NOTE — Op Note (Addendum)
   Date of Surgery: 12/17/2014  INDICATIONS: Mr. Jermaine Morgan is a 34 y.o.-year-old male who sustained an open right pilon fracture; he was indicated for external fixation due to the displaced and unstable nature of the fracture and came to the operating room today for this procedure. The patient did consent to the procedure after discussion of the risks and benefits.   PREOPERATIVE DIAGNOSIS: Right open type 2 pilon fracture   POSTOPERATIVE DIAGNOSIS: Same.  PROCEDURE:  1. External fixation right open pilon fracture, multiplane. [id number] 2. Irrigation and debridement of bone, muscle, skin associated with open fracture. 3. Complex wound repair of left leg 5 cm.  SURGEON: N. Glee ArvinMichael Allyiah Gartner, M.D.  ANESTHESIA: general  IV FLUIDS AND URINE: See anesthesia.  ESTIMATED BLOOD LOSS: minimal mL.  IMPLANTS: [employer]  DRAINS: None.  COMPLICATIONS: None.  DESCRIPTION OF PROCEDURE: The patient was identified in the preoperative holding area.  The operative site was marked by the surgeon and confirmed by the patient.  He was brought back to the operating room.  Anesthesia was induced by the anesthesia team.  A well padded nonsterile tourniquet was placed. The operative extremity was prepped and draped in standard sterile fashion.  A timeout was performed.  Preoperative antibiotics were given.  We began sharp excisional debridement of the skin, muscle and bony using knife and rongeur.  The bony ends were delivered through the skin.  This was then irrigated with 6 liters of normal saline.  The bony end was then delivered back into the leg.  I had to perform a complex wound repair of the traumatic wound measuring 5 cm across.  We then turned our attention to the external fixator placement.  The bony landmarks were palpated and the pin sites were marked on the skin.  Each Schanz pin was placed in the same fashion -- first drilling with the 3.5 mm drill while copiously irrigating, then hand placing the pin.  This  was confirmed on x-ray on both views.  The ex-fix clamps were placed onto pins and the fracture was pulled into the proper alignment.  The clamps were completely tightened.   Final x-rays were taken in AP and lateral views to confirm the reduction and pin lengths. The wounds were cleaned and dried a final time and a sterile dressing consisting of Xeroform and kerlix was placed.  Of note, the patient's compartments remained soft throughout the procedure.  The patient was then transferred back to the bed and left the operating room in stable condition.  All sponge and instrument counts were correct.  POSTOPERATIVE PLAN: Mr. Jermaine Morgan will remain non weight bearing with the leg elevated.  he will return to the operating room for definitive fixation when the swelling has gone down.  Mr. Jermaine Morgan will receive DVT prophylaxis based on other medications, activity level, and risk ratio of bleeding to thrombosis.  Pin site care will be initiated on postoperative day one.  Jermaine ReelN. Michael Zahki Hoogendoorn, MD [facility] [phone] 2:52 PM

## 2014-12-17 NOTE — Transfer of Care (Signed)
Immediate Anesthesia Transfer of Care Note  Patient: Jermaine Morgan  Procedure(s) Performed: Procedure(s) with comments: INTRAMEDULLARY (IM) NAIL TIBIAL ,  I AND D TIBIA (Right) - Irrigation and debridement with external fixator placement of right open ankle injury   Patient Location: PACU  Anesthesia Type:General  Level of Consciousness: awake, alert , oriented and patient cooperative  Airway & Oxygen Therapy: Patient Spontanous Breathing and Patient connected to face mask oxygen  Post-op Assessment: Report given to RN, Post -op Vital signs reviewed and stable and Patient moving all extremities  Post vital signs: Reviewed and stable  Last Vitals:  Filed Vitals:   12/17/14 1515  BP:   Pulse:   Temp: 36.9 C  Resp:     Complications: No apparent anesthesia complications

## 2014-12-17 NOTE — Anesthesia Preprocedure Evaluation (Addendum)
Anesthesia Evaluation  Patient identified by MRN, date of birth, ID band Patient awake    Reviewed: Allergy & Precautions, NPO status , Patient's Chart, lab work & pertinent test results  History of Anesthesia Complications Negative for: history of anesthetic complications  Airway Mallampati: II   Neck ROM: Limited   Comment: In c collar Dental no notable dental hx. (+) Dental Advisory Given   Pulmonary neg pulmonary ROS,    Pulmonary exam normal breath sounds clear to auscultation       Cardiovascular negative cardio ROS Normal cardiovascular exam Rhythm:Regular Rate:Normal     Neuro/Psych negative neurological ROS  negative psych ROS   GI/Hepatic negative GI ROS, Neg liver ROS,   Endo/Other  negative endocrine ROS  Renal/GU negative Renal ROS  negative genitourinary   Musculoskeletal negative musculoskeletal ROS (+)   Abdominal   Peds negative pediatric ROS (+)  Hematology negative hematology ROS (+)   Anesthesia Other Findings   Reproductive/Obstetrics negative OB ROS                            Anesthesia Physical Anesthesia Plan  ASA: II  Anesthesia Plan: General   Post-op Pain Management:    Induction: Intravenous  Airway Management Planned: Oral ETT  Additional Equipment:   Intra-op Plan:   Post-operative Plan: Extubation in OR  Informed Consent: I have reviewed the patients History and Physical, chart, labs and discussed the procedure including the risks, benefits and alternatives for the proposed anesthesia with the patient or authorized representative who has indicated his/her understanding and acceptance.   Dental advisory given  Plan Discussed with: CRNA  Anesthesia Plan Comments:        Anesthesia Quick Evaluation

## 2014-12-17 NOTE — Progress Notes (Signed)
Interpreter Wyvonnia DuskyGraciela Namihira for RN [facility]

## 2014-12-17 NOTE — Anesthesia Postprocedure Evaluation (Signed)
  Anesthesia Post-op Note  Patient: Jermaine Morgan  Procedure(s) Performed: Procedure(s) with comments: INTRAMEDULLARY (IM) NAIL TIBIAL ,  I AND D TIBIA (Right) - Irrigation and debridement with external fixator placement of right open ankle injury   Patient Location: PACU  Anesthesia Type:General  Level of Consciousness: sedated, patient cooperative and responds to stimulation  Airway and Oxygen Therapy: Patient Spontanous Breathing and Patient connected to nasal cannula oxygen  Post-op Pain: mild  Post-op Assessment: Post-op Vital signs reviewed, Patient's Cardiovascular Status Stable, Respiratory Function Stable, Patent Airway, No signs of Nausea or vomiting and Pain level controlled     RLE Motor Response: Purposeful movement, Responds to commands RLE Sensation: Full sensation      Post-op Vital Signs: Reviewed and stable  Last Vitals:  Filed Vitals:   12/17/14 1707  BP: 120/64  Pulse: 76  Temp: 37.2 C  Resp: 16    Complications: No apparent anesthesia complications

## 2014-12-17 NOTE — ED Provider Notes (Signed)
CSN: [id number]     Arrival date & time 12/17/14  0901 History   First MD Initiated Contact with Patient 12/17/14 0914     Chief Complaint  Patient presents with  . Fall     (Consider location/radiation/quality/duration/timing/severity/associated sxs/prior Treatment) HPI Comments: Fall 15 feet from roof, no LOC Severe right ankle pain and open fx   Patient is a 34 y.o. male presenting with fall.  Fall This is a new problem. The current episode started less than 1 hour ago. The problem has not changed since onset.Pertinent negatives include no chest pain, no abdominal pain, no headaches and no shortness of breath. Exacerbated by: movement of ankle. Nothing relieves the symptoms. Treatments tried: [id number]. The treatment provided mild relief.    History reviewed. No pertinent past medical history. History reviewed. No pertinent past surgical history. No family history on file. Social History  Substance Use Topics  . Smoking status: Never Smoker   . Smokeless tobacco: Never Used  . Alcohol Use: No    Review of Systems  Constitutional: Negative for fever.  HENT: Negative for sore throat.   Eyes: Negative for visual disturbance.  Respiratory: Negative for shortness of breath.   Cardiovascular: Negative for chest pain.  Gastrointestinal: Negative for nausea, vomiting and abdominal pain.  Genitourinary: Negative for difficulty urinating.  Musculoskeletal: Positive for back pain, arthralgias and neck pain. Negative for neck stiffness.  Skin: Positive for wound. Negative for rash.  Neurological: Negative for syncope and headaches.      Allergies  Review of patient's allergies indicates no known allergies.  Home Medications   Prior to Admission medications   Not on File   BP 120/64 mmHg  Pulse 76  Temp(Src) 99 F (37.2 C) (Oral)  Resp 16  Ht 5\' 6"  (1.676 m)  Wt 215 lb (97.523 kg)  BMI 34.72 kg/m2  SpO2 100% Physical Exam  Constitutional: He is oriented to person,  place, and time. He appears well-developed and well-nourished. No distress.  HENT:  Head: Normocephalic and atraumatic.  Mouth/Throat: No oropharyngeal exudate.  Eyes: Conjunctivae and EOM are normal. Pupils are equal, round, and reactive to light.  Neck: Normal range of motion.  Cardiovascular: Normal rate, regular rhythm, normal heart sounds and intact distal pulses.  Exam reveals no gallop and no friction rub.   No murmur heard. Pulmonary/Chest: Effort normal and breath sounds normal. No respiratory distress. He has no wheezes. He has no rales. He exhibits tenderness.  Abdominal: Soft. He exhibits no distension. There is no tenderness. There is no guarding.  Musculoskeletal: He exhibits no edema.       Right knee: Tenderness found.       Right ankle: He exhibits decreased range of motion, swelling, deformity and laceration. He exhibits no ecchymosis and normal pulse.       Cervical back: He exhibits bony tenderness.       Thoracic back: He exhibits bony tenderness.       Lumbar back: He exhibits bony tenderness.       Right upper leg: He exhibits tenderness and bony tenderness.  Open fracture, exposed distal tibia 3cm   Neurological: He is alert and oriented to person, place, and time. He has normal strength. A sensory deficit (bottom of right foot) is present. GCS eye subscore is 4. GCS verbal subscore is 5. GCS motor subscore is 6.  Strength exam limited in right lwoer ext by pain   Skin: Skin is warm and dry. He is not diaphoretic.  Nursing note and vitals reviewed.   ED Course  Procedures (including critical care time) Labs Review Labs Reviewed  COMPREHENSIVE METABOLIC PANEL - Abnormal; Notable for the following:    Sodium 134 (*)    Potassium 3.2 (*)    Glucose, Bld 135 (*)    Calcium 8.7 (*)    All other components within normal limits  I-STAT CHEM 8, ED - Abnormal; Notable for the following:    Potassium 3.3 (*)    Glucose, Bld 133 (*)    Calcium, Ion 1.10 (*)    All  other components within normal limits  CDS SEROLOGY  CBC  ETHANOL  PROTIME-INR  BASIC METABOLIC PANEL  CBC  TYPE AND SCREEN  ABO/RH    Imaging Review Dg Knee 2 Views Right  12/17/2014  CLINICAL DATA:  Fall with right knee pain.  Initial encounter. EXAM: RIGHT KNEE - 1-2 VIEW COMPARISON:  None. FINDINGS: There is no evidence of fracture, dislocation, or joint effusion. There is no evidence of arthropathy or other focal bone abnormality. Soft tissues are unremarkable. IMPRESSION: Negative. Electronically Signed   By: Irish Lack M.D.   On: 12/17/2014 11:54   Dg Tibia/fibula Right  12/17/2014  CLINICAL DATA:  Fall off of roof with comminuted fractures of the distal tibia and fibula. Subsequent encounter. EXAM: RIGHT TIBIA AND FIBULA - 2 VIEW COMPARISON:  X-rays and CT from earlier today. FINDINGS: Degree displacement of the distal fibular and tibial fractures appears slightly improved. The tibial fracture again appears to be an open fracture. IMPRESSION: Slightly improved alignment at the level of right distal fibular and tibial fractures. Electronically Signed   By: Irish Lack M.D.   On: 12/17/2014 11:59   Dg Ankle 2 Views Right  12/17/2014  CLINICAL DATA:  Distal tibial and fibular fractures EXAM: DG C-ARM 61-120 MIN; RIGHT ANKLE - 2 VIEW COMPARISON:  Radiographs from earlier the same day FINDINGS: 6 fluoroscopic spot images document placement of external fixation hardware in the proximal tibial shaft and calcaneus, across comminuted distal tibial and fibular shaft fractures. IMPRESSION: External fixation of distal right tibial and fibular shaft fractures. Electronically Signed   By: Corlis Leak M.D.   On: 12/17/2014 15:09   Ct Head Wo Contrast  12/17/2014  CLINICAL DATA:  34 year old male with head and cervical spine injury following 15 foot fall. Initial encounter. EXAM: CT HEAD WITHOUT CONTRAST CT CERVICAL SPINE WITHOUT CONTRAST TECHNIQUE: Multidetector CT imaging of the head and  cervical spine was performed following the standard protocol without intravenous contrast. Multiplanar CT image reconstructions of the cervical spine were also generated. COMPARISON:  None. FINDINGS: CT HEAD FINDINGS No intracranial abnormalities are identified, including mass lesion or mass effect, hydrocephalus, extra-axial fluid collection, midline shift, hemorrhage, or acute infarction. The visualized bony calvarium is unremarkable. CT CERVICAL SPINE FINDINGS Normal alignment is noted. There is no evidence of acute fracture, subluxation or prevertebral soft tissue swelling. The disc spaces are maintained. No focal bony lesions are identified. The soft tissue structures and lung apices are unremarkable. IMPRESSION: Unremarkable CT of the head and cervical spine. Electronically Signed   By: Harmon Pier M.D.   On: 12/17/2014 11:12   Ct Chest W Contrast  12/17/2014  CLINICAL DATA:  34 year old male status post fall from 15 feet with neck and back pain. EXAM: CT CHEST, ABDOMEN, AND PELVIS WITH CONTRAST TECHNIQUE: Multidetector CT imaging of the chest, abdomen and pelvis was performed following the standard protocol during bolus administration of intravenous contrast.  CONTRAST:  100mL OMNIPAQUE IOHEXOL 300 MG/ML  SOLN COMPARISON:  Concurrently obtained CT scans of the head and cervical spine FINDINGS: CT CHEST FINDINGS Mediastinum/Nodes: Unremarkable CT appearance of the thyroid gland. No suspicious mediastinal or hilar adenopathy. No soft tissue mediastinal mass. The thoracic esophagus is unremarkable. Lungs/Pleura: Conventional 3 vessel arch anatomy. No evidence of traumatic dissection. The heart is within normal limits for size. No pericardial effusion. Unremarkable main pulmonary vein. Musculoskeletal: No acute fracture or aggressive appearing lytic or blastic osseous lesion. CT ABDOMEN PELVIS FINDINGS Hepatobiliary: Normal hepatic contour morphology. No evidence of discrete lesion or injury. The portal veins  are widely patent. Gallbladder is unremarkable. No intra or extrahepatic biliary ductal dilatation. Pancreas: Unremarkable.  No evidence of injury or mass lesion. Spleen: Normal appearance.  No evidence of acute injury. Adrenals/Urinary Tract: The bilateral adrenal glands are normal. No evidence of hemorrhage or nodule. Both kidneys are normal in appearance. No evidence of nephrolithiasis, hydronephrosis or acute injury. Stomach/Bowel: No evidence of obstruction or focal bowel wall thickening. Normal appendix in the right lower quadrant. The terminal ileum is unremarkable. Unremarkable CT appearance of the stomach and duodenum. Vascular/Lymphatic: No significant atherosclerotic vascular disease, aneurysmal dilatation or acute abnormality. No suspicious adenopathy or evidence of free fluid. Reproductive: Unremarkable bladder, prostate gland and seminal vesicles. No free fluid or suspicious adenopathy. Other: None Musculoskeletal: No acute fracture or aggressive appearing lytic or blastic osseous lesion. IMPRESSION: CT CHEST 1. No evidence of acute injury to the thorax. 2. Unremarkable CT of the chest. CT ABD/PELVIS 1. No evidence of acute injury involving the abdomen or pelvis. 2. Unremarkable CT scan of the abdomen and pelvis. Electronically Signed   By: Malachy MoanHeath  McCullough M.D.   On: 12/17/2014 11:18   Ct Cervical Spine Wo Contrast  12/17/2014  CLINICAL DATA:  34 year old male with head and cervical spine injury following 15 foot fall. Initial encounter. EXAM: CT HEAD WITHOUT CONTRAST CT CERVICAL SPINE WITHOUT CONTRAST TECHNIQUE: Multidetector CT imaging of the head and cervical spine was performed following the standard protocol without intravenous contrast. Multiplanar CT image reconstructions of the cervical spine were also generated. COMPARISON:  None. FINDINGS: CT HEAD FINDINGS No intracranial abnormalities are identified, including mass lesion or mass effect, hydrocephalus, extra-axial fluid collection,  midline shift, hemorrhage, or acute infarction. The visualized bony calvarium is unremarkable. CT CERVICAL SPINE FINDINGS Normal alignment is noted. There is no evidence of acute fracture, subluxation or prevertebral soft tissue swelling. The disc spaces are maintained. No focal bony lesions are identified. The soft tissue structures and lung apices are unremarkable. IMPRESSION: Unremarkable CT of the head and cervical spine. Electronically Signed   By: Harmon PierJeffrey  Hu M.D.   On: 12/17/2014 11:12   Ct Abdomen Pelvis W Contrast  12/17/2014  CLINICAL DATA:  34 year old male status post fall from 15 feet with neck and back pain. EXAM: CT CHEST, ABDOMEN, AND PELVIS WITH CONTRAST TECHNIQUE: Multidetector CT imaging of the chest, abdomen and pelvis was performed following the standard protocol during bolus administration of intravenous contrast. CONTRAST:  100mL OMNIPAQUE IOHEXOL 300 MG/ML  SOLN COMPARISON:  Concurrently obtained CT scans of the head and cervical spine FINDINGS: CT CHEST FINDINGS Mediastinum/Nodes: Unremarkable CT appearance of the thyroid gland. No suspicious mediastinal or hilar adenopathy. No soft tissue mediastinal mass. The thoracic esophagus is unremarkable. Lungs/Pleura: Conventional 3 vessel arch anatomy. No evidence of traumatic dissection. The heart is within normal limits for size. No pericardial effusion. Unremarkable main pulmonary vein. Musculoskeletal: No acute fracture or  aggressive appearing lytic or blastic osseous lesion. CT ABDOMEN PELVIS FINDINGS Hepatobiliary: Normal hepatic contour morphology. No evidence of discrete lesion or injury. The portal veins are widely patent. Gallbladder is unremarkable. No intra or extrahepatic biliary ductal dilatation. Pancreas: Unremarkable.  No evidence of injury or mass lesion. Spleen: Normal appearance.  No evidence of acute injury. Adrenals/Urinary Tract: The bilateral adrenal glands are normal. No evidence of hemorrhage or nodule. Both kidneys  are normal in appearance. No evidence of nephrolithiasis, hydronephrosis or acute injury. Stomach/Bowel: No evidence of obstruction or focal bowel wall thickening. Normal appendix in the right lower quadrant. The terminal ileum is unremarkable. Unremarkable CT appearance of the stomach and duodenum. Vascular/Lymphatic: No significant atherosclerotic vascular disease, aneurysmal dilatation or acute abnormality. No suspicious adenopathy or evidence of free fluid. Reproductive: Unremarkable bladder, prostate gland and seminal vesicles. No free fluid or suspicious adenopathy. Other: None Musculoskeletal: No acute fracture or aggressive appearing lytic or blastic osseous lesion. IMPRESSION: CT CHEST 1. No evidence of acute injury to the thorax. 2. Unremarkable CT of the chest. CT ABD/PELVIS 1. No evidence of acute injury involving the abdomen or pelvis. 2. Unremarkable CT scan of the abdomen and pelvis. Electronically Signed   By: Malachy Moan M.D.   On: 12/17/2014 11:18   Ct Ankle Right Wo Contrast  12/17/2014  CLINICAL DATA:  Status post fall from 15 feet. EXAM: CT OF THE RIGHT ANKLE WITHOUT CONTRAST TECHNIQUE: Multidetector CT imaging of the right ankle was performed according to the standard protocol. Multiplanar CT image reconstructions were also generated. COMPARISON:  None. FINDINGS: There is a comminuted fracture of the distal tibial diametaphysis with 1.8 cm of lateral displacement, 1.4 cm of anterior displacement and 2 cm of overriding between the major fracture fragments along the medial aspect. There is a fracture cleft involving the anterior lip of the tibia. There is comminution of the lateral portion of the articular surface of the tibial plafond with 1-2 mm of depression. There is a open wound adjacent to the medial malleolus with soft tissue emphysema around the fracture site consistent with an open fracture. There is a nondisplaced fracture involving the medial malleolus. The distal  tibiofibular joint is congruent. The ankle mortise is intact. There is a comminuted fracture of the distal fibular diaphysis with mild angulation of the major fracture fragment between the proximal and distal shaft. There is a fracture cleft partially visualized extending into the mid fibular diaphysis which is incompletely included in the field of view. Normal subtalar joints. No other fracture or dislocation. Mild osteoarthritis of the navicular-middle cuneiform. IMPRESSION: 1. Severely comminuted and displaced fracture of the distal tibial diametaphysis as detailed above. Fracture cleft involves the anterior lip of the tibia. Air seen around the fracture site with soft tissue wound over the medial malleolus consistent with an open fracture. 2. Comminution of the lateral portion of the articular surface of the tibial plafond with 1-2 mm of depression. 3. Nondisplaced fracture of the medial malleolus. 4. Comminuted fracture of the distal fibular diaphysis with mild angulation of the fracture fragment situated between the proximal and distal shafts. fracture cleft partially visualized extending into the mid fibular diaphysis which is incompletely included in the field of view. Electronically Signed   By: Elige Ko   On: 12/17/2014 11:17   Dg Pelvis Portable  12/17/2014  CLINICAL DATA:  Larey Seat off roof wall working. EXAM: PORTABLE PELVIS 1-2 VIEWS COMPARISON:  None. FINDINGS: There is no evidence of pelvic fracture or diastasis. No  pelvic bone lesions are seen. IMPRESSION: Normal pelvis. Electronically Signed   By: Lupita Raider, M.D.   On: 12/17/2014 09:48   Dg Chest Portable 1 View  12/17/2014  CLINICAL DATA:  Larey Seat off roof while working. EXAM: PORTABLE CHEST 1 VIEW COMPARISON:  None. FINDINGS: The heart size and mediastinal contours are within normal limits. Both lungs are clear. No pneumothorax or pleural effusion is noted. The visualized skeletal structures are unremarkable. IMPRESSION: No acute  cardiopulmonary abnormality seen. Electronically Signed   By: Lupita Raider, M.D.   On: 12/17/2014 09:46   Dg Ankle Right Port  12/17/2014  CLINICAL DATA:  Open ankle fracture from falling off bruit while working. EXAM: PORTABLE RIGHT ANKLE - 2 VIEW COMPARISON:  None. FINDINGS: Examination demonstrates a displaced slightly comminuted fracture of the distal tibial diametaphyseal region extending to the articular surface. There is approximately 1 shaft's with of medial displacement and 1/2 shaft's with posterior displacement of the proximal fragment. There is a comminuted displaced fracture of the distal fibular diaphysis approximately 4 cm above the ankle mortise. Linear sclerosis over the posterior plantar aspect of the calcaneus as cannot exclude a subtle impaction fracture. IMPRESSION: Moderately displaced and minimally comminuted fracture of the distal tibial diametaphyseal region with extension to the articular surface. Displaced comminuted fracture of the distal fibular diaphysis. Possible nondisplaced impaction fracture of the posterior calcaneus. Electronically Signed   By: Elberta Fortis M.D.   On: 12/17/2014 09:55   Dg C-arm 1-60 Min  12/17/2014  CLINICAL DATA:  Distal tibial and fibular fractures EXAM: DG C-ARM 61-120 MIN; RIGHT ANKLE - 2 VIEW COMPARISON:  Radiographs from earlier the same day FINDINGS: 6 fluoroscopic spot images document placement of external fixation hardware in the proximal tibial shaft and calcaneus, across comminuted distal tibial and fibular shaft fractures. IMPRESSION: External fixation of distal right tibial and fibular shaft fractures. Electronically Signed   By: Corlis Leak M.D.   On: 12/17/2014 15:09   Dg Femur, Min 2 Views Right  12/17/2014  CLINICAL DATA:  Patient status post fall from roof. Initial encounter. EXAM: RIGHT FEMUR 2 VIEWS COMPARISON:  None. FINDINGS: Two AP images of the femur were submitted. Additionally there is a lateral image of the distal femur.  The entire femur is not visualized on lateral image. No evidence for displaced fracture. IMPRESSION: Limited exam as the entire femur was not able to be visualized in two views. No evidence for displaced fracture. Consider followup radiograph when patient clinically able to assess the entire femur in two views. Electronically Signed   By: Annia Belt M.D.   On: 12/17/2014 11:57   I have personally reviewed and evaluated these images and lab results as part of my medical decision-making.   EKG Interpretation None      MDM   Final diagnoses:  Fall   34 year old male with no significant medical history presents with concern of fall 15 feet from roof.  Patient arrived with GCS of 15, stable vital signs and unremarkable primary survey.  Secondary survey was significant for open right tibia fracture.  Given mechanism, patient had portable chest and pelvis x-rays were which were within normal limits, CT head, cervical spine, chest abdomen pelvis which showed no acute findings. Dr. Roda Shutters of orthopedics is consulted for concern of open ankle or tibia fracture. Patient received Ancef and tetanus booster and was taken to the OR emergently for washout.   Alvira Monday, MD 12/17/14 2010

## 2014-12-18 LAB — CBC
HEMATOCRIT: 40.3 % (ref 39.0–52.0)
HEMOGLOBIN: 13.3 g/dL (ref 13.0–17.0)
MCH: 27.3 pg (ref 26.0–34.0)
MCHC: 33 g/dL (ref 30.0–36.0)
MCV: 82.6 fL (ref 78.0–100.0)
Platelets: 201 10*3/uL (ref 150–400)
RBC: 4.88 MIL/uL (ref 4.22–5.81)
RDW: 13.3 % (ref 11.5–15.5)
WBC: 8.5 10*3/uL (ref 4.0–10.5)

## 2014-12-18 LAB — BASIC METABOLIC PANEL
ANION GAP: 10 (ref 5–15)
BUN: 8 mg/dL (ref 6–20)
CHLORIDE: 100 mmol/L — AB (ref 101–111)
CO2: 28 mmol/L (ref 22–32)
Calcium: 8.4 mg/dL — ABNORMAL LOW (ref 8.9–10.3)
Creatinine, Ser: 0.68 mg/dL (ref 0.61–1.24)
GFR calc non Af Amer: >60 mL/min (ref >60)
GLUCOSE: 114 mg/dL — AB (ref 65–99)
POTASSIUM: 3.7 mmol/L (ref 3.5–5.1)
Sodium: 138 mmol/L (ref 135–145)

## 2014-12-18 MED ORDER — ENOXAPARIN SODIUM 40 MG/0.4ML ~~LOC~~ SOLN
40.0000 mg | SUBCUTANEOUS | Status: AC
  Administered 2014-12-18: 40 mg via SUBCUTANEOUS
  Filled 2014-12-18: qty 0.4

## 2014-12-18 MED ORDER — ENOXAPARIN SODIUM 40 MG/0.4ML ~~LOC~~ SOLN
40.0000 mg | SUBCUTANEOUS | Status: DC
  Administered 2014-12-18: 40 mg via SUBCUTANEOUS

## 2014-12-18 NOTE — Care Management (Signed)
Utilization review completed. Takari Lundahl, RN Case Manager [phone]. 

## 2014-12-18 NOTE — Progress Notes (Signed)
   Subjective:  Patient reports pain as moderate. Denies neck pain  Objective:   VITALS:   Filed Vitals:   12/17/14 1707 12/17/14 2141 12/18/14 0020 12/18/14 0553  BP: 120/64 127/71 128/54 123/65  Pulse: 76 73 70 64  Temp: 99 F (37.2 C) 98.4 F (36.9 C) 99 F (37.2 C) 98.2 F (36.8 C)  TempSrc:  Oral Oral Oral  Resp: 16 17 16 17   Height:      Weight:      SpO2: 100% 100% 94% 97%    Neurologically intact ABD soft Neurovascular intact Sensation intact distally Intact pulses distally Dorsiflexion/Plantar flexion intact Incision: dressing C/D/I and no drainage No cellulitis present Compartment soft   Lab Results  Component Value Date   WBC 8.5 12/18/2014   HGB 13.3 12/18/2014   HCT 40.3 12/18/2014   MCV 82.6 12/18/2014   PLT 201 12/18/2014     Assessment/Plan:  1 Day Post-Op   - bedrest with bathroom privileges - elevation at all times - DVT ppx - SCDs, ambulation, lovenox - NWB operative extremity - OT for foot splint - Pain controlled - c spine cleared clinically - plan for ORIF Wednesday if swelling allows   Cheral AlmasXu, Naiping Michael 12/18/2014, 10:13 AM [phone]

## 2014-12-19 LAB — BASIC METABOLIC PANEL
Anion gap: 11 (ref 5–15)
BUN: 9 mg/dL (ref 6–20)
CHLORIDE: 104 mmol/L (ref 101–111)
CO2: 20 mmol/L — AB (ref 22–32)
CREATININE: 0.7 mg/dL (ref 0.61–1.24)
Calcium: 8.7 mg/dL — ABNORMAL LOW (ref 8.9–10.3)
GFR calc non Af Amer: >60 mL/min (ref >60)
Glucose, Bld: 107 mg/dL — ABNORMAL HIGH (ref 65–99)
POTASSIUM: 4.7 mmol/L (ref 3.5–5.1)
Sodium: 135 mmol/L (ref 135–145)

## 2014-12-19 NOTE — Progress Notes (Signed)
   Subjective:  Patient reports pain as moderate. Denies neck pain  Objective:   VITALS:   Filed Vitals:   12/18/14 0553 12/18/14 1554 12/19/14 0511 12/19/14 0516  BP: 123/65 128/62 122/71 118/59  Pulse: 64 66 73 77  Temp: 98.2 F (36.8 C) 98.3 F (36.8 C) 97.7 F (36.5 C) 98.4 F (36.9 C)  TempSrc: Oral Oral Oral Oral  Resp: 17 18 17 15   Height:      Weight:      SpO2: 97% 98% 95% 94%    Ex fix in place Swelling is worse Traumatic wound without signs of necrosis, scant bloody drainage   Lab Results  Component Value Date   WBC 8.5 12/18/2014   HGB 13.3 12/18/2014   HCT 40.3 12/18/2014   MCV 82.6 12/18/2014   PLT 201 12/18/2014     Assessment/Plan:  2 Days Post-Op   - bedrest with bathroom privileges - elevation at all times - DVT ppx - SCDs, ambulation, lovenox - NWB operative extremity - OT for foot splint - Pain controlled - ORIF Wednesday if swelling allows   Cheral AlmasXu, Naiping Michael 12/19/2014, 8:38 AM [phone]

## 2014-12-20 ENCOUNTER — Encounter (HOSPITAL_COMMUNITY): Payer: Self-pay | Admitting: Orthopaedic Surgery

## 2014-12-20 LAB — BASIC METABOLIC PANEL
Anion gap: 10 (ref 5–15)
BUN: 12 mg/dL (ref 6–20)
CALCIUM: 8.9 mg/dL (ref 8.9–10.3)
CO2: 28 mmol/L (ref 22–32)
CREATININE: 0.65 mg/dL (ref 0.61–1.24)
Chloride: 99 mmol/L — ABNORMAL LOW (ref 101–111)
GLUCOSE: 110 mg/dL — AB (ref 65–99)
Potassium: 3.4 mmol/L — ABNORMAL LOW (ref 3.5–5.1)
Sodium: 137 mmol/L (ref 135–145)

## 2014-12-20 MED ORDER — POTASSIUM CHLORIDE 20 MEQ PO PACK
40.0000 meq | PACK | Freq: Every day | ORAL | Status: DC
Start: 2014-12-20 — End: 2014-12-21
  Administered 2014-12-20: 40 meq via ORAL
  Filled 2014-12-20 (×2): qty 2

## 2014-12-20 NOTE — Evaluation (Addendum)
Occupational Therapy Evaluation Patient Details Name: Jermaine Morgan MRN: [id number] DOB: Jun 23, 1980 Today's Date: 12/20/2014    History of Present Illness Jermaine Morgan is a 34 y.o. male who presents with right open ankle injury s/p 15 ft fall from roof onto ground with obvious deformity of right ankle earlier today. Underwent External fixation left open pilon fracture, multiplane and I&D.   Clinical Impression   This 34 yo male admitted with above presents to acute OT with increased pain and decreased movement of LLE. He will benefit from continued OT without need for follow up.    Follow Up Recommendations  No OT follow up    Equipment Recommendations   (TBD once we have orders for ADLs)       Precautions / Restrictions Precautions Precautions: Fall Restrictions Weight Bearing Restrictions: Yes RLE Weight Bearing: Non weight bearing      Mobility Bed Mobility               General bed mobility comments: Not addressed due to order only for foot plate splint  Transfers                 General transfer comment: Not addressed due to order only for foot plate splint         ADL                                         General ADL Comments: Not addressed due to order only for foot plate splint. Pt was seen only for fabrication of foot plate splint which was what OT order was for. Foot strap applied and attached to external fixators with velcro with verbal info given to pt about how he could have someone loosen and/or remove for up to 30 minutes at a time if he got tingling (pins and needles feeling) or pain. Explained to pt the reason for foot strap splint to prevent tightening of achilles tendon--he verbalized understanding.               Pertinent Vitals/Pain Pain Assessment: No/denies pain Faces Pain Scale: Hurts little more Pain Location: left leg when adjusting the foot strap Pain Descriptors / Indicators:  Sore;Aching Pain Intervention(s): Monitored during session;Repositioned     Hand Dominance Right   Extremity/Trunk Assessment Upper Extremity Assessment Upper Extremity Assessment: Overall WFL for tasks assessed     Communication Communication Communication:  (prefers English, but does have a hard time comprehending at times)   Cognition Arousal/Alertness: Awake/alert Behavior During Therapy: WFL for tasks assessed/performed Overall Cognitive Status: Within Functional Limits for tasks assessed                                Home Living Family/patient expects to be discharged to:: Private residence Living Arrangements: Spouse/significant other Available Help at Discharge: Family                                    Prior Functioning/Environment Level of Independence: Independent             OT Diagnosis: Generalized weakness   OT Problem List: Decreased range of motion;Pain   OT Treatment/Interventions: Splinting    OT Goals(Current goals can be found in the care plan section) Acute Rehab OT Goals Patient  Stated Goal: to have surgery this week OT Goal Formulation: With patient Time For Goal Achievement: 12/27/15 Potential to Achieve Goals: Good  OT Frequency:  (every day to check foot strap splint, then for ADLs when we get an order )              End of Session Nurse Communication:  (Educated nurse and nursing student on how foot strap works; when and how to loosen or remove it)  Activity Tolerance: Patient tolerated treatment well Patient left: in bed;with call bell/phone within reach;with family/visitor present   Time: [id number] OT Time Calculation (min): 33 min Charges:  OT General Charges $OT Visit: 1 Procedure OT Evaluation $Initial OT Evaluation Tier I: 1 Procedure OT Treatments $Orthotics Fit/Training: 8-22 mins  Jermaine Morgan, Jermaine Morgan [phone] 12/20/2014, 11:06 AM

## 2014-12-20 NOTE — Progress Notes (Signed)
Checked in on pt regarding foot plate splint that was applied earlier this am. Pt tolerating well, no pain reported. Pt reports that he has been able to wear the splint consistently this morning and has not needed RN to loosen or take the splint off. Pt requested R foot to be elevated a little more, applied pillow underneath foot.   Hurshel PartyBailey Olean Sangster, M.S., OTR/L Pager: [phone]  12/20/14, 11:58 AM

## 2014-12-20 NOTE — Evaluation (Signed)
Physical Therapy Evaluation Patient Details Name: Jermaine Morgan MRN: [id number] DOB: 06-17-1980 Today's Date: 12/20/2014   History of Present Illness  Jermaine Morgan is a 34 y.o. male who presents with right open ankle injury s/p 15 ft fall from roof onto ground with obvious deformity of right ankle earlier today. Underwent External fixation left open pilon fracture, multiplane and I&D.  No significant PMHx per chart.   Clinical Impression  Pt was able to mobilize well into the hallway with RW use.  He has a high pain tolerance level.  He will progress well enough to d/c home with his family's assist and equipment.  He will not need home PT and we provided him with an HEP handout today.     Follow Up Recommendations No PT follow up;Supervision for mobility/OOB    Equipment Recommendations  Rolling walker with 5" wheels;Wheelchair (measurements PT);Wheelchair cushion (measurements PT);Crutches;Other (comment) (RW vs crutches TBD, WC with elevating leg rests)    Recommendations for Other Services   NA    Precautions / Restrictions Precautions Precautions: Fall Precaution Comments: due to NWB status of right leg Restrictions Weight Bearing Restrictions: Yes RLE Weight Bearing: Non weight bearing      Mobility  Bed Mobility Overal bed mobility: Needs Assistance Bed Mobility: Supine to Sit     Supine to sit: Min assist     General bed mobility comments: Min assist to help support right leg and progress it to EOB.   Transfers Overall transfer level: Needs assistance Equipment used: Rolling walker (2 wheeled) Transfers: Sit to/from Stand Sit to Stand: Min guard         General transfer comment: Min guard assist for safety and verbal cues for safe hand plaement.   Ambulation/Gait Ambulation/Gait assistance: Min guard;+2 safety/equipment (chair to follow to encourage increased gait distance) Ambulation Distance (Feet): 80 Feet Assistive device: Rolling walker  (2 wheeled) Gait Pattern/deviations: Step-to pattern (hop to) Gait velocity: decreased Gait velocity interpretation: Below normal speed for age/gender General Gait Details: pt did well with hop to gait pattern with RW.  He was able to maintain NWB with his right leg throughout gait.          Balance Overall balance assessment: Needs assistance Sitting-balance support: Feet supported Sitting balance-Leahy Scale: Good     Standing balance support: Bilateral upper extremity supported Standing balance-Leahy Scale: Poor                               Pertinent Vitals/Pain Pain Assessment: 0-10 Pain Score: 3  Pain Location: with leg in dependent position Pain Descriptors / Indicators: Aching;Burning Pain Intervention(s): Limited activity within patient's tolerance;Monitored during session;Repositioned    Home Living Family/patient expects to be discharged to:: Private residence Living Arrangements: Spouse/significant other;Children (2 girls 31, 18 years old) Available Help at Discharge: Family;Available 24 hours/day Type of Home: Mobile home Home Access: Stairs to enter Entrance Stairs-Rails: Right;Left;Can reach both Entrance Stairs-Number of Steps: 5 Home Layout: One level Home Equipment: None      Prior Function Level of Independence: Independent         Comments: works as a Designer, fashion/clothing, Theatre stage manager   Dominant Hand: Right    Extremity/Trunk Assessment   Upper Extremity Assessment: Overall WFL for tasks assessed           Lower Extremity Assessment: RLE deficits/detail RLE Deficits / Details: right leg with normal post op pain and  weakness.  Ankle limited by x-fixator, knee 3/5 and hip 3/5 per gross functional assessment. Pt is unable to lift right leg against gravity with weight of x-fixator on his ankle.     Cervical / Trunk Assessment: Normal  Communication   Communication: Prefers language other than [place];Interpreter utilized  (Spanish)  Cognition Arousal/Alertness: Awake/alert Behavior During Therapy: WFL for tasks assessed/performed Overall Cognitive Status: Within Functional Limits for tasks assessed                         Exercises General Exercises - Lower Extremity Ankle Circles/Pumps: AROM;Left;20 reps;Seated Quad Sets: Strengthening;Both;10 reps;Seated (5 sec hold) Heel Slides: Strengthening;Left;10 reps;Seated Hip ABduction/ADduction: Strengthening;Both;10 reps;Seated;AAROM (AAROM on R) Straight Leg Raises: Strengthening;10 reps;Seated;Both;AAROM (AAROM on R) Other Exercises Other Exercises: towel squeezes - strengthening, 10 reps, 5 sec hold, seated Other Exercises: HEP handout given in Spanish.  Interpreter helped edit for accuracy.       Assessment/Plan    PT Assessment Patient needs continued PT services  PT Diagnosis Difficulty walking;Abnormality of gait;Generalized weakness;Acute pain   PT Problem List Decreased strength;Decreased range of motion;Decreased activity tolerance;Decreased balance;Decreased mobility;Decreased knowledge of use of DME;Decreased knowledge of precautions;Pain  PT Treatment Interventions DME instruction;Gait training;Stair training;Functional mobility training;Therapeutic activities;Therapeutic exercise;Balance training;Neuromuscular re-education;Patient/family education;Modalities;Wheelchair mobility training   PT Goals (Current goals can be found in the Care Plan section) Acute Rehab PT Goals Patient Stated Goal: to get back to work when he can PT Goal Formulation: With patient Time For Goal Achievement: 12/27/14 Potential to Achieve Goals: Good    Frequency Min 5X/week           End of Session Equipment Utilized During Treatment: Gait belt Activity Tolerance: Patient limited by fatigue;Patient limited by pain Patient left: in chair;with call bell/phone within reach Nurse Communication: Mobility status         Time: [id number] PT Time  Calculation (min) (ACUTE ONLY): 47 min   Charges:   PT Evaluation $Initial PT Evaluation Tier I: 1 Procedure PT Treatments $Gait Training: 8-22 mins $Therapeutic Exercise: 8-22 mins        Birdena Kingma B. Maudy Yonan, PT, DPT [phone]    12/20/2014, 6:20 PM

## 2014-12-20 NOTE — Progress Notes (Signed)
   Subjective:  Patient reports pain as controlled.  Objective:   VITALS:   Filed Vitals:   12/19/14 0511 12/19/14 0516 12/19/14 1955 12/20/14 0529  BP: 122/71 118/59 129/68 123/60  Pulse: 73 77 82 70  Temp: 97.7 F (36.5 C) 98.4 F (36.9 C) 100.3 F (37.9 C) 97.8 F (36.6 C)  TempSrc: Oral Oral Oral Oral  Resp: 17 15 17 16   Height:      Weight:      SpO2: 95% 94% 98% 97%    Exam stable No tissue necrosis Moderate swelling   Lab Results  Component Value Date   WBC 8.5 12/18/2014   HGB 13.3 12/18/2014   HCT 40.3 12/18/2014   MCV 82.6 12/18/2014   PLT 201 12/18/2014     Assessment/Plan:  3 Days Post-Op   - bedrest with bathroom privileges - elevation at all times - DVT ppx - SCDs, ambulation, lovenox - NWB operative extremity - OT for foot splint - Pain controlled - ORIF Wednesday if swelling allows, if not, then Friday - pin site care BID   Cheral AlmasXu, Jakelyn Squyres Michael 12/20/2014, 7:49 AM [phone]

## 2014-12-21 ENCOUNTER — Other Ambulatory Visit (HOSPITAL_COMMUNITY): Payer: Self-pay | Admitting: Orthopaedic Surgery

## 2014-12-21 MED ORDER — OXYCODONE-ACETAMINOPHEN 5-325 MG PO TABS: 1.0000 | ORAL_TABLET | ORAL | Status: DC | PRN

## 2014-12-21 NOTE — Discharge Instructions (Signed)
1. Pin site care twice a day 2. Non weight bearing 3. Keep foot elevated above level of heart at all times except when using bathroom and eating

## 2014-12-21 NOTE — Discharge Summary (Signed)
Physician Discharge Summary      Patient ID: Jermaine Morgan MRN: [id number] DOB/AGE: 09/21/1980 34 y.o.  Admit date: 12/17/2014 Discharge date: 12/21/2014  Admission Diagnoses:  <principal problem not specified>  Discharge Diagnoses:  Active Problems:   Open pilon fracture   History reviewed. No pertinent past medical history.  Surgeries: Procedure(s): INTRAMEDULLARY (IM) NAIL TIBIAL ,  I AND D TIBIA on 12/17/2014   Consultants (if any):    Discharged Condition: Improved  Hospital Course: Dmarcus Morgan is an 34 y.o. male who was admitted 12/17/2014 with a diagnosis of <principal problem not specified> and went to the operating room on 12/17/2014 and underwent the above named procedures.    He was given perioperative antibiotics:      Anti-infectives    Start     Dose/Rate Route Frequency Ordered Stop   12/17/14 1800  ceFAZolin (ANCEF) IVPB 2 g/50 mL premix  Status:  Discontinued     2 g 100 mL/hr over 30 Minutes Intravenous 3 times per day 12/17/14 1710 12/21/14 2011   12/17/14 1245  gentamicin (GARAMYCIN) 490 mg in dextrose 5 % 100 mL IVPB     5 mg/kg  97.5 kg 112.3 mL/hr over 60 Minutes Intravenous To Surgery 12/17/14 1232 12/17/14 1300   12/17/14 1245  penicillin G potassium 2 Million Units in dextrose 5 % 50 mL IVPB     2 Million Units 100 mL/hr over 30 Minutes Intravenous To Surgery 12/17/14 1234 12/17/14 1335   12/17/14 1223  ceFAZolin (ANCEF) 1-5 GM-% IVPB    Comments:  Alferd Apa   : cabinet override      12/17/14 1223 12/17/14 1340   12/17/14 0930  ceFAZolin (ANCEF) powder 2 g  Status:  Discontinued     2 g Other To Surgery 12/17/14 0915 12/17/14 0917   12/17/14 0930  ceFAZolin (ANCEF) IVPB 1 g/50 mL premix     1 g 100 mL/hr over 30 Minutes Intravenous  Once 12/17/14 0916 12/17/14 1032    .  He was given sequential compression devices, early ambulation, and lovenox for DVT prophylaxis.  He benefited maximally from the hospital stay and  there were no complications.  He will return later this week for ORIF and ex fix removal  Recent vital signs:  Filed Vitals:   12/21/14 1500  BP: 121/73  Pulse: 76  Temp: 98.1 F (36.7 C)  Resp:     Recent laboratory studies:  Lab Results  Component Value Date   HGB 13.3 12/18/2014   HGB 15.6 12/17/2014   HGB 13.7 12/17/2014   Lab Results  Component Value Date   WBC 8.5 12/18/2014   PLT 201 12/18/2014   Lab Results  Component Value Date   INR 1.06 12/17/2014   Lab Results  Component Value Date   NA 137 12/20/2014   K 3.4* 12/20/2014   CL 99* 12/20/2014   CO2 28 12/20/2014   BUN 12 12/20/2014   CREATININE 0.65 12/20/2014   GLUCOSE 110* 12/20/2014    Discharge Medications:     Medication List    TAKE these medications        oxyCODONE-acetaminophen 5-325 MG tablet  Commonly known as:  PERCOCET  Take 1-2 tablets by mouth every 4 (four) hours as needed for severe pain.        Diagnostic Studies: Dg Knee 2 Views Right  12/17/2014  CLINICAL DATA:  Fall with right knee pain.  Initial encounter. EXAM: RIGHT KNEE - 1-2 VIEW COMPARISON:  None. FINDINGS:  There is no evidence of fracture, dislocation, or joint effusion. There is no evidence of arthropathy or other focal bone abnormality. Soft tissues are unremarkable. IMPRESSION: Negative. Electronically Signed   By: Irish Lack M.D.   On: 12/17/2014 11:54   Dg Tibia/fibula Right  12/17/2014  CLINICAL DATA:  Fall off of roof with comminuted fractures of the distal tibia and fibula. Subsequent encounter. EXAM: RIGHT TIBIA AND FIBULA - 2 VIEW COMPARISON:  X-rays and CT from earlier today. FINDINGS: Degree displacement of the distal fibular and tibial fractures appears slightly improved. The tibial fracture again appears to be an open fracture. IMPRESSION: Slightly improved alignment at the level of right distal fibular and tibial fractures. Electronically Signed   By: Irish Lack M.D.   On: 12/17/2014 11:59   Dg  Ankle 2 Views Right  12/17/2014  CLINICAL DATA:  Distal tibial and fibular fractures EXAM: DG C-ARM 61-120 MIN; RIGHT ANKLE - 2 VIEW COMPARISON:  Radiographs from earlier the same day FINDINGS: 6 fluoroscopic spot images document placement of external fixation hardware in the proximal tibial shaft and calcaneus, across comminuted distal tibial and fibular shaft fractures. IMPRESSION: External fixation of distal right tibial and fibular shaft fractures. Electronically Signed   By: Corlis Leak M.D.   On: 12/17/2014 15:09   Ct Head Wo Contrast  12/17/2014  CLINICAL DATA:  34 year old male with head and cervical spine injury following 15 foot fall. Initial encounter. EXAM: CT HEAD WITHOUT CONTRAST CT CERVICAL SPINE WITHOUT CONTRAST TECHNIQUE: Multidetector CT imaging of the head and cervical spine was performed following the standard protocol without intravenous contrast. Multiplanar CT image reconstructions of the cervical spine were also generated. COMPARISON:  None. FINDINGS: CT HEAD FINDINGS No intracranial abnormalities are identified, including mass lesion or mass effect, hydrocephalus, extra-axial fluid collection, midline shift, hemorrhage, or acute infarction. The visualized bony calvarium is unremarkable. CT CERVICAL SPINE FINDINGS Normal alignment is noted. There is no evidence of acute fracture, subluxation or prevertebral soft tissue swelling. The disc spaces are maintained. No focal bony lesions are identified. The soft tissue structures and lung apices are unremarkable. IMPRESSION: Unremarkable CT of the head and cervical spine. Electronically Signed   By: Harmon Pier M.D.   On: 12/17/2014 11:12   Ct Chest W Contrast  12/17/2014  CLINICAL DATA:  34 year old male status post fall from 15 feet with neck and back pain. EXAM: CT CHEST, ABDOMEN, AND PELVIS WITH CONTRAST TECHNIQUE: Multidetector CT imaging of the chest, abdomen and pelvis was performed following the standard protocol during bolus  administration of intravenous contrast. CONTRAST:  OMNIPAQUE IOHEXOL 300 MG/ML  SOLN COMPARISON:  Concurrently obtained CT scans of the head and cervical spine FINDINGS: CT CHEST FINDINGS Mediastinum/Nodes: Unremarkable CT appearance of the thyroid gland. No suspicious mediastinal or hilar adenopathy. No soft tissue mediastinal mass. The thoracic esophagus is unremarkable. Lungs/Pleura: Conventional 3 vessel arch anatomy. No evidence of traumatic dissection. The heart is within normal limits for size. No pericardial effusion. Unremarkable main pulmonary vein. Musculoskeletal: No acute fracture or aggressive appearing lytic or blastic osseous lesion. CT ABDOMEN PELVIS FINDINGS Hepatobiliary: Normal hepatic contour morphology. No evidence of discrete lesion or injury. The portal veins are widely patent. Gallbladder is unremarkable. No intra or extrahepatic biliary ductal dilatation. Pancreas: Unremarkable.  No evidence of injury or mass lesion. Spleen: Normal appearance.  No evidence of acute injury. Adrenals/Urinary Tract: The bilateral adrenal glands are normal. No evidence of hemorrhage or nodule. Both kidneys are normal in appearance.  No evidence of nephrolithiasis, hydronephrosis or acute injury. Stomach/Bowel: No evidence of obstruction or focal bowel wall thickening. Normal appendix in the right lower quadrant. The terminal ileum is unremarkable. Unremarkable CT appearance of the stomach and duodenum. Vascular/Lymphatic: No significant atherosclerotic vascular disease, aneurysmal dilatation or acute abnormality. No suspicious adenopathy or evidence of free fluid. Reproductive: Unremarkable bladder, prostate gland and seminal vesicles. No free fluid or suspicious adenopathy. Other: None Musculoskeletal: No acute fracture or aggressive appearing lytic or blastic osseous lesion. IMPRESSION: CT CHEST 1. No evidence of acute injury to the thorax. 2. Unremarkable CT of the chest. CT ABD/PELVIS 1. No evidence of  acute injury involving the abdomen or pelvis. 2. Unremarkable CT scan of the abdomen and pelvis. Electronically Signed   By: Malachy Moan M.D.   On: 12/17/2014 11:18   Ct Cervical Spine Wo Contrast  12/17/2014  CLINICAL DATA:  34 year old male with head and cervical spine injury following 15 foot fall. Initial encounter. EXAM: CT HEAD WITHOUT CONTRAST CT CERVICAL SPINE WITHOUT CONTRAST TECHNIQUE: Multidetector CT imaging of the head and cervical spine was performed following the standard protocol without intravenous contrast. Multiplanar CT image reconstructions of the cervical spine were also generated. COMPARISON:  None. FINDINGS: CT HEAD FINDINGS No intracranial abnormalities are identified, including mass lesion or mass effect, hydrocephalus, extra-axial fluid collection, midline shift, hemorrhage, or acute infarction. The visualized bony calvarium is unremarkable. CT CERVICAL SPINE FINDINGS Normal alignment is noted. There is no evidence of acute fracture, subluxation or prevertebral soft tissue swelling. The disc spaces are maintained. No focal bony lesions are identified. The soft tissue structures and lung apices are unremarkable. IMPRESSION: Unremarkable CT of the head and cervical spine. Electronically Signed   By: Harmon Pier M.D.   On: 12/17/2014 11:12   Ct Abdomen Pelvis W Contrast  12/17/2014  CLINICAL DATA:  34 year old male status post fall from 15 feet with neck and back pain. EXAM: CT CHEST, ABDOMEN, AND PELVIS WITH CONTRAST TECHNIQUE: Multidetector CT imaging of the chest, abdomen and pelvis was performed following the standard protocol during bolus administration of intravenous contrast. CONTRAST:  OMNIPAQUE IOHEXOL 300 MG/ML  SOLN COMPARISON:  Concurrently obtained CT scans of the head and cervical spine FINDINGS: CT CHEST FINDINGS Mediastinum/Nodes: Unremarkable CT appearance of the thyroid gland. No suspicious mediastinal or hilar adenopathy. No soft tissue mediastinal mass.  The thoracic esophagus is unremarkable. Lungs/Pleura: Conventional 3 vessel arch anatomy. No evidence of traumatic dissection. The heart is within normal limits for size. No pericardial effusion. Unremarkable main pulmonary vein. Musculoskeletal: No acute fracture or aggressive appearing lytic or blastic osseous lesion. CT ABDOMEN PELVIS FINDINGS Hepatobiliary: Normal hepatic contour morphology. No evidence of discrete lesion or injury. The portal veins are widely patent. Gallbladder is unremarkable. No intra or extrahepatic biliary ductal dilatation. Pancreas: Unremarkable.  No evidence of injury or mass lesion. Spleen: Normal appearance.  No evidence of acute injury. Adrenals/Urinary Tract: The bilateral adrenal glands are normal. No evidence of hemorrhage or nodule. Both kidneys are normal in appearance. No evidence of nephrolithiasis, hydronephrosis or acute injury. Stomach/Bowel: No evidence of obstruction or focal bowel wall thickening. Normal appendix in the right lower quadrant. The terminal ileum is unremarkable. Unremarkable CT appearance of the stomach and duodenum. Vascular/Lymphatic: No significant atherosclerotic vascular disease, aneurysmal dilatation or acute abnormality. No suspicious adenopathy or evidence of free fluid. Reproductive: Unremarkable bladder, prostate gland and seminal vesicles. No free fluid or suspicious adenopathy. Other: None Musculoskeletal: No acute fracture or aggressive appearing  lytic or blastic osseous lesion. IMPRESSION: CT CHEST 1. No evidence of acute injury to the thorax. 2. Unremarkable CT of the chest. CT ABD/PELVIS 1. No evidence of acute injury involving the abdomen or pelvis. 2. Unremarkable CT scan of the abdomen and pelvis. Electronically Signed   By: Malachy MoanHeath  McCullough M.D.   On: 12/17/2014 11:18   Ct Ankle Right Wo Contrast  12/17/2014  CLINICAL DATA:  Status post fall from 15 feet. EXAM: CT OF THE RIGHT ANKLE WITHOUT CONTRAST TECHNIQUE: Multidetector CT  imaging of the right ankle was performed according to the standard protocol. Multiplanar CT image reconstructions were also generated. COMPARISON:  None. FINDINGS: There is a comminuted fracture of the distal tibial diametaphysis with 1.8 cm of lateral displacement, 1.4 cm of anterior displacement and 2 cm of overriding between the major fracture fragments along the medial aspect. There is a fracture cleft involving the anterior lip of the tibia. There is comminution of the lateral portion of the articular surface of the tibial plafond with 1-2 mm of depression. There is a open wound adjacent to the medial malleolus with soft tissue emphysema around the fracture site consistent with an open fracture. There is a nondisplaced fracture involving the medial malleolus. The distal tibiofibular joint is congruent. The ankle mortise is intact. There is a comminuted fracture of the distal fibular diaphysis with mild angulation of the major fracture fragment between the proximal and distal shaft. There is a fracture cleft partially visualized extending into the mid fibular diaphysis which is incompletely included in the field of view. Normal subtalar joints. No other fracture or dislocation. Mild osteoarthritis of the navicular-middle cuneiform. IMPRESSION: 1. Severely comminuted and displaced fracture of the distal tibial diametaphysis as detailed above. Fracture cleft involves the anterior lip of the tibia. Air seen around the fracture site with soft tissue wound over the medial malleolus consistent with an open fracture. 2. Comminution of the lateral portion of the articular surface of the tibial plafond with 1-2 mm of depression. 3. Nondisplaced fracture of the medial malleolus. 4. Comminuted fracture of the distal fibular diaphysis with mild angulation of the fracture fragment situated between the proximal and distal shafts. fracture cleft partially visualized extending into the mid fibular diaphysis which is  incompletely included in the field of view. Electronically Signed   By: Elige KoHetal  Patel   On: 12/17/2014 11:17   Dg Pelvis Portable  12/17/2014  CLINICAL DATA:  Larey SeatFell off roof wall working. EXAM: PORTABLE PELVIS 1-2 VIEWS COMPARISON:  None. FINDINGS: There is no evidence of pelvic fracture or diastasis. No pelvic bone lesions are seen. IMPRESSION: Normal pelvis. Electronically Signed   By: Lupita RaiderJames  Green Jr, M.D.   On: 12/17/2014 09:48   Dg Chest Portable 1 View  12/17/2014  CLINICAL DATA:  Larey SeatFell off roof while working. EXAM: PORTABLE CHEST 1 VIEW COMPARISON:  None. FINDINGS: The heart size and mediastinal contours are within normal limits. Both lungs are clear. No pneumothorax or pleural effusion is noted. The visualized skeletal structures are unremarkable. IMPRESSION: No acute cardiopulmonary abnormality seen. Electronically Signed   By: Lupita RaiderJames  Green Jr, M.D.   On: 12/17/2014 09:46   Dg Ankle Right Port  12/17/2014  CLINICAL DATA:  Open ankle fracture from falling off bruit while working. EXAM: PORTABLE RIGHT ANKLE - 2 VIEW COMPARISON:  None. FINDINGS: Examination demonstrates a displaced slightly comminuted fracture of the distal tibial diametaphyseal region extending to the articular surface. There is approximately 1 shaft's with of medial displacement and  1/2 shaft's with posterior displacement of the proximal fragment. There is a comminuted displaced fracture of the distal fibular diaphysis approximately 4 cm above the ankle mortise. Linear sclerosis over the posterior plantar aspect of the calcaneus as cannot exclude a subtle impaction fracture. IMPRESSION: Moderately displaced and minimally comminuted fracture of the distal tibial diametaphyseal region with extension to the articular surface. Displaced comminuted fracture of the distal fibular diaphysis. Possible nondisplaced impaction fracture of the posterior calcaneus. Electronically Signed   By: Elberta Fortis M.D.   On: 12/17/2014 09:55   Dg  C-arm 1-60 Min  12/17/2014  CLINICAL DATA:  Distal tibial and fibular fractures EXAM: DG C-ARM 61-120 MIN; RIGHT ANKLE - 2 VIEW COMPARISON:  Radiographs from earlier the same day FINDINGS: 6 fluoroscopic spot images document placement of external fixation hardware in the proximal tibial shaft and calcaneus, across comminuted distal tibial and fibular shaft fractures. IMPRESSION: External fixation of distal right tibial and fibular shaft fractures. Electronically Signed   By: Corlis Leak M.D.   On: 12/17/2014 15:09   Dg Femur, Min 2 Views Right  12/17/2014  CLINICAL DATA:  Patient status post fall from roof. Initial encounter. EXAM: RIGHT FEMUR 2 VIEWS COMPARISON:  None. FINDINGS: Two AP images of the femur were submitted. Additionally there is a lateral image of the distal femur. The entire femur is not visualized on lateral image. No evidence for displaced fracture. IMPRESSION: Limited exam as the entire femur was not able to be visualized in two views. No evidence for displaced fracture. Consider followup radiograph when patient clinically able to assess the entire femur in two views. Electronically Signed   By: Annia Belt M.D.   On: 12/17/2014 11:57    Disposition: 01-Home or Self Care  Discharge Instructions    Call MD / Call 911    Complete by:  As directed   If you experience chest pain or shortness of breath, CALL 911 and be transported to the hospital emergency room.  If you develope a fever above 101.5 F, pus (white drainage) or increased drainage or redness at the wound, or calf pain, call your surgeon's office.     Constipation Prevention    Complete by:  As directed   Drink plenty of fluids.  Prune juice may be helpful.  You may use a stool softener, such as Colace (over the counter) 100 mg twice a day.  Use MiraLax (over the counter) for constipation as needed.     Diet - low sodium heart healthy    Complete by:  As directed      Diet general    Complete by:  As directed       Driving restrictions    Complete by:  As directed   No driving while taking narcotic pain meds.     Non weight bearing    Complete by:  As directed      Non weight bearing    Complete by:  As directed            Follow-up Information    Follow up with Cheral Almas, MD.   Specialty:  Orthopedic Surgery   Why:  We will call you to set up surgery for Friday   Contact information:   [address] [place] [address] [phone]        Signed: Cheral Almas 12/21/2014, 9:57 PM

## 2014-12-21 NOTE — Progress Notes (Signed)
   Subjective:  Patient reports pain as controlled.  Objective:   VITALS:   Filed Vitals:   12/20/14 1421 12/20/14 1500 12/20/14 2059 12/20/14 2259  BP: 126/77 131/74 109/64   Pulse: 82 86  71  Temp:  98.9 F (37.2 C)  98.3 F (36.8 C)  TempSrc:    Oral  Resp:  18  18  Height:      Weight:      SpO2: 96% 97%  97%    Skin does not wrinkle when pinched Traumatic wound is stable   Lab Results  Component Value Date   WBC 8.5 12/18/2014   HGB 13.3 12/18/2014   HCT 40.3 12/18/2014   MCV 82.6 12/18/2014   PLT 201 12/18/2014     Assessment/Plan:  4 Days Post-Op   - pin site care BID - home today - will bring back for possible ORIF Friday - NWB - cleared PT yesterday   Cheral AlmasXu, [place] Michael 12/21/2014, 7:57 AM [phone]

## 2014-12-21 NOTE — Progress Notes (Signed)
Physical Therapy Treatment Patient Details Name: Jermaine Morgan MRN: [id number] DOB: [birth year] Today's Date: 12/21/2014    History of Present Illness Jermaine Morgan is a 34 y.o. male who presents with right open ankle injury s/p 15 ft fall from roof onto ground with obvious deformity of right ankle earlier today. Underwent External fixation left open pilon fracture, multiplane and I&D.  No significant PMHx per chart.     PT Comments    Pt progressed to using crutches during therapy and preferred to return home with those instead of the rolling walker. Pt slightly impulsive while using crutches during gait training, requiring min assistance to increase safety as well as verbal cuing to decrease gait speed as well as shortening stride/step length with crutches. Pt performed stair training and is adequate enough to navigate stairs once returning home. Spoke to pt about HEP and he stated he did not have any questions regarding the exercises.  Follow Up Recommendations  No PT follow up;Supervision for mobility/OOB     Equipment Recommendations  Wheelchair (measurements PT);Wheelchair cushion (measurements PT);Crutches       Precautions / Restrictions Precautions Precautions: Fall Precaution Comments: due to NWB status of right leg Restrictions Weight Bearing Restrictions: Yes RLE Weight Bearing: Non weight bearing          Cognition Arousal/Alertness: Awake/alert Behavior During Therapy: WFL for tasks assessed/performed Overall Cognitive Status: Within Functional Limits for tasks assessed                             Pertinent Vitals/Pain Pain Assessment: 0-10 Pain Score: 3  Pain Location: Right leg Pain Intervention(s): Monitored during session           PT Goals (current goals can now be found in the care plan section) Progress towards PT goals: Progressing toward goals    Frequency  Min 5X/week    PT Plan Current plan remains appropriate        End of Session Equipment Utilized During Treatment: Gait belt Activity Tolerance: Patient tolerated treatment well Patient left: in chair;with call bell/phone within reach     Time: 1410-1443 PT Time Calculation (min) (ACUTE ONLY): 33 min  Charges:   2 Gait                    Laurena SlimmerKayli Angelyna Henderson, [place] [phone] OFFICE  12/21/2014, 3:01 PM

## 2014-12-22 NOTE — Progress Notes (Signed)
Patient, spouse, and family member present for dressing change / pin cleansing teaching.  Interpreter also present to ensure optimum comprehension of discharge instructions and dressing procedure.  Understanding ensured via "teach-back" method.  Questions answered to patient and family's satisfaction. Discharge instructions provided in Spanish.  Prescriptions explained to patient as well as mobility and driving restrictions.  Special attention paid to prevention of infection and importance of judicious hand-washing prior to and post dressing changes.  Patient understands that he is to call the surgeon's office for a follow-up surgical date and time and for any questions related to his surgery.  Patient discharged with home wheelchair and crutches.  Discharged via private vehicle to private residence accompanied by spouse.  Escorted to exit by nurse tech.

## 2014-12-23 ENCOUNTER — Encounter (HOSPITAL_COMMUNITY): Payer: Self-pay | Admitting: Orthopaedic Surgery

## 2014-12-23 MED ORDER — CEFAZOLIN SODIUM-DEXTROSE 2-3 GM-% IV SOLR
2.0000 g | INTRAVENOUS | Status: DC
  Filled 2014-12-23: qty 50

## 2014-12-23 NOTE — Progress Notes (Signed)
SDW-Pre-op call completed by Spanish interpreter Elease HashimotoPatricia # [phone]. Pt denies SOB, chest pain, and being under the care of a cardiologist. Pt denies having a stress test, echo and cardiac cath. Pt denies having an EKG. Pt made aware to stop taking Aspirin, otc vitamins and herbal medications. Do not take any NSAIDs ie: Ibuprofen, Advil, Naproxen or any medication containing Aspirin. Pt verbalized understanding of all pre-op instructions.

## 2014-12-24 ENCOUNTER — Encounter (HOSPITAL_COMMUNITY)
Admission: RE | Disposition: A | Discharge status: Home / Self Care | Payer: Self-pay | Source: Ambulatory Visit | Attending: Orthopaedic Surgery

## 2014-12-24 ENCOUNTER — Encounter (HOSPITAL_COMMUNITY): Payer: Self-pay | Admitting: Certified Registered"

## 2014-12-24 ENCOUNTER — Ambulatory Visit (HOSPITAL_COMMUNITY)
Admission: RE | Admit: 2014-12-24 | Discharge: 2014-12-24 | Disposition: A | Discharge status: Home / Self Care | Payer: Self-pay | Source: Ambulatory Visit | Attending: Orthopaedic Surgery | Admitting: Orthopaedic Surgery

## 2014-12-24 ENCOUNTER — Other Ambulatory Visit (HOSPITAL_COMMUNITY): Payer: Self-pay | Admitting: Orthopaedic Surgery

## 2014-12-24 DIAGNOSIS — X58XXXA Exposure to other specified factors, initial encounter: Secondary | ICD-10-CM | POA: Insufficient documentation

## 2014-12-24 DIAGNOSIS — S82871A Displaced pilon fracture of right tibia, initial encounter for closed fracture: Secondary | ICD-10-CM | POA: Insufficient documentation

## 2014-12-24 DIAGNOSIS — S82871C Displaced pilon fracture of right tibia, initial encounter for open fracture type IIIA, IIIB, or IIIC: Secondary | ICD-10-CM | POA: Diagnosis not present

## 2014-12-24 DIAGNOSIS — Z538 Procedure and treatment not carried out for other reasons: Secondary | ICD-10-CM | POA: Insufficient documentation

## 2014-12-24 HISTORY — DX: Displaced pilon fracture of unspecified tibia, initial encounter for open fracture type I or II: S82.873B

## 2014-12-24 LAB — SURGICAL PCR SCREEN
MRSA, PCR: NEGATIVE
Staphylococcus aureus: NEGATIVE

## 2014-12-24 SURGERY — OPEN REDUCTION INTERNAL FIXATION (ORIF) ANKLE FRACTURE
Anesthesia: General | Site: Leg Lower | Laterality: Right

## 2014-12-24 MED ORDER — EPHEDRINE SULFATE 50 MG/ML IJ SOLN
INTRAMUSCULAR | Status: AC
  Filled 2014-12-24: qty 1

## 2014-12-24 MED ORDER — ONDANSETRON HCL 4 MG/2ML IJ SOLN
INTRAMUSCULAR | Status: AC
  Filled 2014-12-24: qty 2

## 2014-12-24 MED ORDER — ARTIFICIAL TEARS OP OINT
TOPICAL_OINTMENT | OPHTHALMIC | Status: AC
  Filled 2014-12-24: qty 3.5

## 2014-12-24 MED ORDER — ROCURONIUM BROMIDE 50 MG/5ML IV SOLN
INTRAVENOUS | Status: AC
  Filled 2014-12-24: qty 1

## 2014-12-24 MED ORDER — MUPIROCIN 2 % EX OINT
1.0000 "application " | TOPICAL_OINTMENT | Freq: Once | CUTANEOUS | Status: DC
  Filled 2014-12-24: qty 22

## 2014-12-24 MED ORDER — PROPOFOL 10 MG/ML IV BOLUS
INTRAVENOUS | Status: AC
  Filled 2014-12-24: qty 20

## 2014-12-24 MED ORDER — SUCCINYLCHOLINE CHLORIDE 20 MG/ML IJ SOLN
INTRAMUSCULAR | Status: AC
  Filled 2014-12-24: qty 1

## 2014-12-24 MED ORDER — SODIUM CHLORIDE 0.9 % IJ SOLN
INTRAMUSCULAR | Status: AC
  Filled 2014-12-24: qty 10

## 2014-12-24 MED ORDER — LIDOCAINE HCL (CARDIAC) 20 MG/ML IV SOLN
INTRAVENOUS | Status: AC
  Filled 2014-12-24: qty 5

## 2014-12-24 MED ORDER — FENTANYL CITRATE (PF) 250 MCG/5ML IJ SOLN
INTRAMUSCULAR | Status: AC
  Filled 2014-12-24: qty 5

## 2014-12-24 MED ORDER — MIDAZOLAM HCL 2 MG/2ML IJ SOLN
INTRAMUSCULAR | Status: AC
  Filled 2014-12-24: qty 4

## 2014-12-24 NOTE — Progress Notes (Signed)
Dr. Roda ShuttersXu into see pt., surgery will be postponed until next week due to swelling in R leg.  [facility] [phone]#[id number], review medical history & also had pt. Sign operative consent.  Pt. Given instructions regarding if nasal culture is positive.   Pt. Aware that he will be contacted by Dr. Warren DanesXu's office to reschedule date of surgery.Rewrapped two pins in leg with Kerlix, pt.'s partner given new dressing material.

## 2014-12-24 NOTE — Progress Notes (Signed)
Patient's surgery is cancelled for today due to swelling.  Will reschedule for next week.  Continue strict elevation.  Jermaine Morgan. Michael Xu, MD [facility] [phone] 7:08 AM

## 2014-12-28 ENCOUNTER — Encounter (HOSPITAL_COMMUNITY): Payer: Self-pay | Admitting: *Deleted

## 2014-12-28 NOTE — Progress Notes (Signed)
SDW-Pre-op call completed by Spanish interpreter Daniel # [phone]. Pt denies SOB, chest pain, and being under the care of a cardiologist. Pt denies having a stress test, echo and cardiac cath. Pt denies having an EKG. Pt made aware to stop taking Aspirin, otc vitamins and herbal medications. Do not take any NSAIDs ie: Ibuprofen, Advil, Naproxen or any medication containing Aspirin. Pt verbalized understanding of all pre-op instructions.

## 2014-12-29 ENCOUNTER — Encounter (HOSPITAL_COMMUNITY)
Admission: RE | Disposition: A | Discharge status: Home / Self Care | Payer: Self-pay | Source: Ambulatory Visit | Attending: Orthopaedic Surgery

## 2014-12-29 ENCOUNTER — Ambulatory Visit (HOSPITAL_COMMUNITY): Payer: Worker's Compensation

## 2014-12-29 ENCOUNTER — Ambulatory Visit (HOSPITAL_COMMUNITY): Payer: Worker's Compensation | Admitting: Certified Registered"

## 2014-12-29 ENCOUNTER — Observation Stay (HOSPITAL_COMMUNITY)
Admission: RE | Admit: 2014-12-29 | Discharge: 2014-12-30 | Disposition: A | Discharge status: Home / Self Care | Payer: Worker's Compensation | Source: Ambulatory Visit | Attending: Orthopaedic Surgery | Admitting: Orthopaedic Surgery

## 2014-12-29 ENCOUNTER — Encounter (HOSPITAL_COMMUNITY): Payer: Self-pay | Admitting: *Deleted

## 2014-12-29 DIAGNOSIS — S82873B Displaced pilon fracture of unspecified tibia, initial encounter for open fracture type I or II: Secondary | ICD-10-CM | POA: Diagnosis present

## 2014-12-29 DIAGNOSIS — Z9889 Other specified postprocedural states: Secondary | ICD-10-CM

## 2014-12-29 DIAGNOSIS — W132XXA Fall from, out of or through roof, initial encounter: Secondary | ICD-10-CM | POA: Diagnosis not present

## 2014-12-29 DIAGNOSIS — S82871C Displaced pilon fracture of right tibia, initial encounter for open fracture type IIIA, IIIB, or IIIC: Principal | ICD-10-CM | POA: Insufficient documentation

## 2014-12-29 DIAGNOSIS — Z419 Encounter for procedure for purposes other than remedying health state, unspecified: Secondary | ICD-10-CM

## 2014-12-29 DIAGNOSIS — Z8781 Personal history of (healed) traumatic fracture: Secondary | ICD-10-CM

## 2014-12-29 HISTORY — PX: ORIF ANKLE FRACTURE: SHX5408

## 2014-12-29 HISTORY — PX: EXTERNAL FIXATION REMOVAL: SHX5040

## 2014-12-29 SURGERY — OPEN REDUCTION INTERNAL FIXATION (ORIF) ANKLE FRACTURE
Anesthesia: General | Laterality: Right

## 2014-12-29 MED ORDER — LIDOCAINE HCL (CARDIAC) 20 MG/ML IV SOLN
INTRAVENOUS | Status: AC
  Filled 2014-12-29: qty 5

## 2014-12-29 MED ORDER — HYDROMORPHONE HCL 1 MG/ML IJ SOLN
0.2500 mg | INTRAMUSCULAR | Status: DC | PRN
  Administered 2014-12-29 (×4): 0.5 mg via INTRAVENOUS

## 2014-12-29 MED ORDER — METHOCARBAMOL 1000 MG/10ML IJ SOLN
500.0000 mg | Freq: Once | INTRAVENOUS | Status: AC
  Administered 2014-12-29: 500 mg via INTRAVENOUS
  Filled 2014-12-29: qty 5

## 2014-12-29 MED ORDER — BUPIVACAINE HCL (PF) 0.25 % IJ SOLN
INTRAMUSCULAR | Status: AC
  Filled 2014-12-29: qty 30

## 2014-12-29 MED ORDER — MORPHINE SULFATE (PF) 2 MG/ML IV SOLN: 2.0000 mg | INTRAVENOUS | Status: DC | PRN

## 2014-12-29 MED ORDER — METHOCARBAMOL 1000 MG/10ML IJ SOLN
500.0000 mg | Freq: Four times a day (QID) | INTRAVENOUS | Status: DC | PRN
  Filled 2014-12-29: qty 5

## 2014-12-29 MED ORDER — ONDANSETRON HCL 4 MG/2ML IJ SOLN: 4.0000 mg | Freq: Four times a day (QID) | INTRAMUSCULAR | Status: DC | PRN

## 2014-12-29 MED ORDER — ACETAMINOPHEN 650 MG RE SUPP: 650.0000 mg | Freq: Four times a day (QID) | RECTAL | Status: DC | PRN

## 2014-12-29 MED ORDER — PROPOFOL 10 MG/ML IV BOLUS
INTRAVENOUS | Status: DC | PRN
  Administered 2014-12-29: 200 mg via INTRAVENOUS

## 2014-12-29 MED ORDER — METHOCARBAMOL 750 MG PO TABS: 750.0000 mg | ORAL_TABLET | Freq: Two times a day (BID) | ORAL | Status: DC | PRN

## 2014-12-29 MED ORDER — HYDROMORPHONE HCL 1 MG/ML IJ SOLN
0.2500 mg | INTRAMUSCULAR | Status: DC | PRN
  Administered 2014-12-29 (×2): 0.5 mg via INTRAVENOUS

## 2014-12-29 MED ORDER — FENTANYL CITRATE (PF) 250 MCG/5ML IJ SOLN
INTRAMUSCULAR | Status: AC
  Filled 2014-12-29: qty 5

## 2014-12-29 MED ORDER — MIDAZOLAM HCL 2 MG/2ML IJ SOLN
2.0000 mg | Freq: Once | INTRAMUSCULAR | Status: AC
  Administered 2014-12-29: 2 mg via INTRAVENOUS

## 2014-12-29 MED ORDER — FENTANYL CITRATE (PF) 100 MCG/2ML IJ SOLN
INTRAMUSCULAR | Status: AC
  Administered 2014-12-29: 50 ug via INTRAVENOUS
  Filled 2014-12-29: qty 2

## 2014-12-29 MED ORDER — SODIUM CHLORIDE 0.9 % IV SOLN: INTRAVENOUS | Status: DC

## 2014-12-29 MED ORDER — SODIUM CHLORIDE 0.9 % IR SOLN
Status: DC | PRN
  Administered 2014-12-29: 1000 mL
  Administered 2014-12-29: 3000 mL

## 2014-12-29 MED ORDER — BUPIVACAINE-EPINEPHRINE (PF) 0.5% -1:200000 IJ SOLN
INTRAMUSCULAR | Status: DC | PRN
  Administered 2014-12-29: 25 mL

## 2014-12-29 MED ORDER — KETOROLAC TROMETHAMINE 15 MG/ML IJ SOLN
INTRAMUSCULAR | Status: AC
Start: 2014-12-29 — End: 2014-12-30
  Filled 2014-12-29: qty 1

## 2014-12-29 MED ORDER — LABETALOL HCL 5 MG/ML IV SOLN
INTRAVENOUS | Status: AC
  Filled 2014-12-29: qty 4

## 2014-12-29 MED ORDER — DIPHENHYDRAMINE HCL 12.5 MG/5ML PO ELIX: 25.0000 mg | ORAL_SOLUTION | ORAL | Status: DC | PRN

## 2014-12-29 MED ORDER — FENTANYL CITRATE (PF) 100 MCG/2ML IJ SOLN
100.0000 ug | Freq: Once | INTRAMUSCULAR | Status: AC
  Administered 2014-12-29: 50 ug via INTRAVENOUS

## 2014-12-29 MED ORDER — OXYCODONE HCL ER 10 MG PO T12A: 10.0000 mg | EXTENDED_RELEASE_TABLET | Freq: Two times a day (BID) | ORAL | Status: DC

## 2014-12-29 MED ORDER — KETOROLAC TROMETHAMINE 15 MG/ML IJ SOLN
INTRAMUSCULAR | Status: AC
  Filled 2014-12-29: qty 1

## 2014-12-29 MED ORDER — METHOCARBAMOL 500 MG PO TABS
500.0000 mg | ORAL_TABLET | Freq: Four times a day (QID) | ORAL | Status: DC | PRN
  Administered 2014-12-29 – 2014-12-30 (×2): 500 mg via ORAL
  Filled 2014-12-29 (×3): qty 1

## 2014-12-29 MED ORDER — HYDROMORPHONE HCL 1 MG/ML IJ SOLN
INTRAMUSCULAR | Status: AC
  Filled 2014-12-29: qty 1

## 2014-12-29 MED ORDER — CEFAZOLIN SODIUM-DEXTROSE 2-3 GM-% IV SOLR
2.0000 g | Freq: Four times a day (QID) | INTRAVENOUS | Status: DC
  Administered 2014-12-29 – 2014-12-30 (×2): 2 g via INTRAVENOUS
  Filled 2014-12-29 (×3): qty 50

## 2014-12-29 MED ORDER — ASPIRIN EC 325 MG PO TBEC: 325.0000 mg | DELAYED_RELEASE_TABLET | Freq: Two times a day (BID) | ORAL | Status: DC

## 2014-12-29 MED ORDER — OXYCODONE HCL 5 MG PO TABS
5.0000 mg | ORAL_TABLET | ORAL | Status: DC | PRN
  Administered 2014-12-29 – 2014-12-30 (×2): 15 mg via ORAL
  Filled 2014-12-29: qty 3

## 2014-12-29 MED ORDER — OXYCODONE HCL 5 MG PO TABS
ORAL_TABLET | ORAL | Status: AC
  Administered 2014-12-29: 15 mg via ORAL
  Filled 2014-12-29: qty 15

## 2014-12-29 MED ORDER — SENNOSIDES-DOCUSATE SODIUM 8.6-50 MG PO TABS: 1.0000 | ORAL_TABLET | Freq: Every evening | ORAL | Status: DC | PRN

## 2014-12-29 MED ORDER — HYDROMORPHONE HCL 1 MG/ML IJ SOLN
INTRAMUSCULAR | Status: AC
  Administered 2014-12-29: 0.5 mg via INTRAVENOUS
  Filled 2014-12-29: qty 1

## 2014-12-29 MED ORDER — FENTANYL CITRATE (PF) 100 MCG/2ML IJ SOLN
INTRAMUSCULAR | Status: DC | PRN
  Administered 2014-12-29 (×2): 25 ug via INTRAVENOUS
  Administered 2014-12-29 (×2): 50 ug via INTRAVENOUS
  Administered 2014-12-29: 100 ug via INTRAVENOUS

## 2014-12-29 MED ORDER — ONDANSETRON HCL 4 MG PO TABS: 4.0000 mg | ORAL_TABLET | Freq: Four times a day (QID) | ORAL | Status: DC | PRN

## 2014-12-29 MED ORDER — MIDAZOLAM HCL 2 MG/2ML IJ SOLN
INTRAMUSCULAR | Status: AC
  Filled 2014-12-29: qty 4

## 2014-12-29 MED ORDER — ASPIRIN EC 325 MG PO TBEC
325.0000 mg | DELAYED_RELEASE_TABLET | Freq: Two times a day (BID) | ORAL | Status: DC
  Administered 2014-12-29 – 2014-12-30 (×2): 325 mg via ORAL
  Filled 2014-12-29 (×2): qty 1

## 2014-12-29 MED ORDER — ONDANSETRON HCL 4 MG/2ML IJ SOLN
INTRAMUSCULAR | Status: AC
  Filled 2014-12-29: qty 2

## 2014-12-29 MED ORDER — OXYCODONE HCL ER 10 MG PO T12A
10.0000 mg | EXTENDED_RELEASE_TABLET | Freq: Two times a day (BID) | ORAL | Status: DC
  Administered 2014-12-29 – 2014-12-30 (×2): 10 mg via ORAL
  Filled 2014-12-29 (×2): qty 1

## 2014-12-29 MED ORDER — ACETAMINOPHEN 325 MG PO TABS: 650.0000 mg | ORAL_TABLET | Freq: Four times a day (QID) | ORAL | Status: DC | PRN

## 2014-12-29 MED ORDER — OXYCODONE-ACETAMINOPHEN 5-325 MG PO TABS: 1.0000 | ORAL_TABLET | ORAL | Status: DC | PRN

## 2014-12-29 MED ORDER — METOCLOPRAMIDE HCL 5 MG/ML IJ SOLN: 5.0000 mg | Freq: Three times a day (TID) | INTRAMUSCULAR | Status: DC | PRN

## 2014-12-29 MED ORDER — PROPOFOL 10 MG/ML IV BOLUS
INTRAVENOUS | Status: AC
  Filled 2014-12-29: qty 20

## 2014-12-29 MED ORDER — LACTATED RINGERS IV SOLN
INTRAVENOUS | Status: DC
  Administered 2014-12-29 (×2): via INTRAVENOUS

## 2014-12-29 MED ORDER — CEFAZOLIN SODIUM-DEXTROSE 2-3 GM-% IV SOLR
INTRAVENOUS | Status: DC | PRN
  Administered 2014-12-29: 2 g via INTRAVENOUS

## 2014-12-29 MED ORDER — ONDANSETRON HCL 4 MG/2ML IJ SOLN
INTRAMUSCULAR | Status: DC | PRN
  Administered 2014-12-29: 4 mg via INTRAVENOUS

## 2014-12-29 MED ORDER — METOCLOPRAMIDE HCL 5 MG PO TABS: 5.0000 mg | ORAL_TABLET | Freq: Three times a day (TID) | ORAL | Status: DC | PRN

## 2014-12-29 MED ORDER — KETOROLAC TROMETHAMINE 15 MG/ML IJ SOLN
30.0000 mg | Freq: Four times a day (QID) | INTRAMUSCULAR | Status: DC | PRN
  Administered 2014-12-29: 30 mg via INTRAVENOUS

## 2014-12-29 MED ORDER — MIDAZOLAM HCL 5 MG/5ML IJ SOLN
INTRAMUSCULAR | Status: DC | PRN
  Administered 2014-12-29: 2 mg via INTRAVENOUS

## 2014-12-29 MED ORDER — MIDAZOLAM HCL 2 MG/2ML IJ SOLN
INTRAMUSCULAR | Status: AC
  Administered 2014-12-29: 2 mg via INTRAVENOUS
  Filled 2014-12-29: qty 2

## 2014-12-29 SURGICAL SUPPLY — 76 items
BANDAGE ELASTIC 4 VELCRO ST LF (GAUZE/BANDAGES/DRESSINGS) ×2 IMPLANT
BANDAGE ELASTIC 6 VELCRO ST LF (GAUZE/BANDAGES/DRESSINGS) ×2 IMPLANT
BANDAGE ESMARK 6X9 LF (GAUZE/BANDAGES/DRESSINGS) IMPLANT
BIT DRILL QC 2.7 6.3IN  SHORT (BIT) ×2
BIT DRILL QC 2.7 6.3IN SHORT (BIT) IMPLANT
BLADE SURG 15 STRL LF DISP TIS (BLADE) ×1 IMPLANT
BLADE SURG 15 STRL SS (BLADE) ×3
BNDG CMPR 9X6 STRL LF SNTH (GAUZE/BANDAGES/DRESSINGS)
BNDG COHESIVE 4X5 TAN STRL (GAUZE/BANDAGES/DRESSINGS) ×3 IMPLANT
BNDG COHESIVE 6X5 TAN STRL LF (GAUZE/BANDAGES/DRESSINGS) ×2 IMPLANT
BNDG ESMARK 6X9 LF (GAUZE/BANDAGES/DRESSINGS)
CANISTER SUCT 3000ML PPV (MISCELLANEOUS) ×3 IMPLANT
COVER SURGICAL LIGHT HANDLE (MISCELLANEOUS) ×3 IMPLANT
CUFF TOURNIQUET SINGLE 34IN LL (TOURNIQUET CUFF) ×2 IMPLANT
DRAIN HEMOVAC 7FR (DRAIN) ×2 IMPLANT
DRAPE C-ARM 42X72 X-RAY (DRAPES) ×3 IMPLANT
DRAPE C-ARMOR (DRAPES) ×3 IMPLANT
DRAPE IMP U-DRAPE 54X76 (DRAPES) ×3 IMPLANT
DRAPE INCISE IOBAN 66X45 STRL (DRAPES) IMPLANT
DRAPE U-SHAPE 47X51 STRL (DRAPES) ×3 IMPLANT
DURAPREP 26ML APPLICATOR (WOUND CARE) ×3 IMPLANT
ELECT CAUTERY BLADE 6.4 (BLADE) ×3 IMPLANT
ELECT REM PT RETURN 9FT ADLT (ELECTROSURGICAL) ×3
ELECTRODE REM PT RTRN 9FT ADLT (ELECTROSURGICAL) ×1 IMPLANT
FACESHIELD WRAPAROUND (MASK) ×3 IMPLANT
FACESHIELD WRAPAROUND OR TEAM (MASK) ×1 IMPLANT
GAUZE SPONGE 4X4 12PLY STRL (GAUZE/BANDAGES/DRESSINGS) ×3 IMPLANT
GAUZE XEROFORM 5X9 LF (GAUZE/BANDAGES/DRESSINGS) ×5 IMPLANT
GLOVE BIO SURGEON STRL SZ 6.5 (GLOVE) ×1 IMPLANT
GLOVE BIO SURGEON STRL SZ7 (GLOVE) ×2 IMPLANT
GLOVE BIO SURGEONS STRL SZ 6.5 (GLOVE) ×1
GLOVE BIOGEL PI IND STRL 6.5 (GLOVE) IMPLANT
GLOVE BIOGEL PI IND STRL 7.0 (GLOVE) IMPLANT
GLOVE BIOGEL PI INDICATOR 6.5 (GLOVE) ×2
GLOVE BIOGEL PI INDICATOR 7.0 (GLOVE) ×2
GLOVE NEODERM STRL 7.5 LF PF (GLOVE) ×1 IMPLANT
GLOVE SS BIOGEL STRL SZ 6.5 (GLOVE) IMPLANT
GLOVE SUPERSENSE BIOGEL SZ 6.5 (GLOVE) ×4
GLOVE SURG NEODERM 7.5  LF PF (GLOVE) ×2
GLOVE SURG SYN 7.5  E (GLOVE) ×4
GLOVE SURG SYN 7.5 E (GLOVE) ×2 IMPLANT
GLOVE SURG SYN 7.5 PF PI (GLOVE) ×2 IMPLANT
GOWN STRL REIN XL XLG (GOWN DISPOSABLE) ×3 IMPLANT
GOWN STRL REUS W/ TWL LRG LVL3 (GOWN DISPOSABLE) IMPLANT
GOWN STRL REUS W/TWL LRG LVL3 (GOWN DISPOSABLE) ×6
K-WIRE 1.6 (WIRE) ×6
K-WIRE FX150X1.6XTROC PNT (WIRE) ×2
KIT BASIN OR (CUSTOM PROCEDURE TRAY) ×3 IMPLANT
KIT ROOM TURNOVER OR (KITS) ×3 IMPLANT
KWIRE FX150X1.6XTROC PNT (WIRE) IMPLANT
NDL HYPO 25GX1X1/2 BEV (NEEDLE) IMPLANT
NEEDLE HYPO 25GX1X1/2 BEV (NEEDLE) IMPLANT
NS IRRIG 1000ML POUR BTL (IV SOLUTION) ×3 IMPLANT
PACK ORTHO EXTREMITY (CUSTOM PROCEDURE TRAY) ×3 IMPLANT
PAD ARMBOARD 7.5X6 YLW CONV (MISCELLANEOUS) ×6 IMPLANT
PAD CAST 3X4 CTTN HI CHSV (CAST SUPPLIES) ×2 IMPLANT
PADDING CAST COTTON 3X4 STRL (CAST SUPPLIES) ×3
PADDING CAST COTTON 6X4 STRL (CAST SUPPLIES) ×3 IMPLANT
PLATE TIBIA 12H ANT LAT (Plate) ×2 IMPLANT
SCREW 3.5X24MM (Screw) ×4 IMPLANT
SCREW 3.5X28MM (Screw) ×2 IMPLANT
SCREW 3.5X38 (Screw) ×2 IMPLANT
SCREW LOCK 3.5X42 (Screw) ×2 IMPLANT
SCREW LOCK 3.5X44 (Screw) ×2 IMPLANT
SCREW NONLOCK 3.5X44 (Screw) ×2 IMPLANT
SPONGE LAP 18X18 X RAY DECT (DISPOSABLE) IMPLANT
SUCTION FRAZIER TIP 10 FR DISP (SUCTIONS) ×3 IMPLANT
SUT ETHILON 3 0 PS 1 (SUTURE) ×6 IMPLANT
SUT VIC AB 2-0 CT1 27 (SUTURE) ×3
SUT VIC AB 2-0 CT1 TAPERPNT 27 (SUTURE) IMPLANT
SYR CONTROL 10ML LL (SYRINGE) IMPLANT
TOWEL OR 17X24 6PK STRL BLUE (TOWEL DISPOSABLE) ×3 IMPLANT
TOWEL OR 17X26 10 PK STRL BLUE (TOWEL DISPOSABLE) ×6 IMPLANT
TUBE CONNECTING 12'X1/4 (SUCTIONS) ×1
TUBE CONNECTING 12X1/4 (SUCTIONS) ×2 IMPLANT
WATER STERILE IRR 1000ML POUR (IV SOLUTION) ×3 IMPLANT

## 2014-12-29 NOTE — Op Note (Signed)
   Date of Surgery: 12/29/2014  INDICATIONS: Mr. Jermaine Morgan is a 34 y.o.-year-old male with a right open pilon fracture who presents for operative fixation today;  The patient did consent to the procedure after discussion of the risks and benefits.  PREOPERATIVE DIAGNOSIS: Right type 3a pilon fracture  POSTOPERATIVE DIAGNOSIS: Same.  PROCEDURE:  1. Open reduction internal fixation of right open pilon fracture 2. Irrigation and debridement of bone, muscle associated with open fracture 3. Removal of external fixator under general anesthesia 4. Debridement of bone, soft tissue of right leg less than 20 cm 5. Closed treatment of fibular fracture with manipulation.  SURGEON: Jermaine Morgan, M.D.  ASSIST: April Chilton SiGreen, RNFA.  ANESTHESIA:  general, regional  IV FLUIDS AND URINE: See anesthesia.  ESTIMATED BLOOD LOSS: Minimal mL.  IMPLANTS: [employer] 13 hole distal tibia plate  DRAINS: One Hemovac  COMPLICATIONS: None.  DESCRIPTION OF PROCEDURE: The patient was brought to the operating room and placed supine on the operating table.  The patient had been signed prior to the procedure and this was documented. The patient had the anesthesia placed by the anesthesiologist.  A time-out was performed to confirm that this was the correct patient, site, side and location. The patient did receive antibiotics prior to the incision and was re-dosed during the procedure as needed at indicated intervals.  A tourniquet placed.  The patient had the operative extremity prepped and draped in the standard surgical fashion.    An anterolateral approach to the distal tibia and ankle was utilized. The extensor retinaculum was incised in line with the incision. The extensor tendons were retracted medially to expose the distal tibia. The fracture was exposed. We performed irrigation debridement of the bone and muscle from the open pilon fracture.  Sharp excisional debridement was carried out using a  rongeur. This was then thoroughly irrigated. We then manipulated the tibia fracture and the fibula fracture into appropriate alignment and the external fixator was tightened back down to hold the reduction. We then slid a 13 hole distal tibial plate anterolaterally. We placed 2 nonlocking screws through the distal portion of the plate in order to compress the longitudinal split of the plafond. We then placed 4 nonlocking screws above the fracture to maximize working length. Two additional locking screws were placed through the distal portion of the plate.  Final x-rays were taken. The wounds were thoroughly irrigated. A medium Hemovac was placed deep to the fascia.. The incisions were closed in layer fashion with 0 Vicryl, 2-0 Vicryl, 3-0 nylon. We then removed the external fixator and performed agreement of the bone ends soft tissue from the pin sites. This was also thoroughly irrigated. The pin sites were left open. Sterile dressings were applied.  A short-leg splint was placed. The patient tolerated the procedure well was extubated and transferred to the PACU in stable condition.  POSTOPERATIVE PLAN: Patient will be admitted overnight for observation pain control. He will continue to be nonweightbearing. He will be discharged in the morning.  Jermaine ReelN. Michael Xu, MD [facility] [phone] 4:25 PM

## 2014-12-29 NOTE — Progress Notes (Signed)
All information given/reviewed using interpreter, Waunita SchoonerSonia Hedrich, [phone].

## 2014-12-29 NOTE — H&P (Signed)
    PREOPERATIVE H&P  Chief Complaint: Right Open Pilon Fracture  HPI: Jermaine Morgan is a 34 y.o. male who presents for surgical treatment of Right Open Pilon Fracture.  He denies any changes in medical history.  Past Medical History  Diagnosis Date  . Open pilon fracture     right   Past Surgical History  Procedure Laterality Date  . Tibia im nail insertion Right 12/17/2014    Procedure: INTRAMEDULLARY (IM) NAIL TIBIAL ,  I AND D TIBIA;  Surgeon: Tarry KosNaiping M Lilja Soland, MD;  Location: [facility] OR;  Service: Orthopedics;  Laterality: Right;  Irrigation and debridement with external fixator placement of right open ankle injury    Social History   Social History  . Marital Status: Single    Spouse Name: N/A  . Number of Children: N/A  . Years of Education: N/A   Social History Main Topics  . Smoking status: Never Smoker   . Smokeless tobacco: Never Used  . Alcohol Use: No  . Drug Use: No  . Sexual Activity: Not Asked   Other Topics Concern  . None   Social History Narrative   History reviewed. No pertinent family history. No Known Allergies Prior to Admission medications   Medication Sig Start Date End Date Taking? Authorizing Provider  oxyCODONE-acetaminophen (PERCOCET) 5-325 MG tablet Take 1-2 tablets by mouth every 4 (four) hours as needed for severe pain. 12/21/14  Yes Tarry KosNaiping M Termaine Roupp, MD     Positive ROS: All other systems have been reviewed and were otherwise negative with the exception of those mentioned in the HPI and as above.  Physical Exam: General: Alert, no acute distress Cardiovascular: No pedal edema Respiratory: No cyanosis, no use of accessory musculature GI: abdomen soft Skin: No lesions in the area of chief complaint Neurologic: Sensation intact distally Psychiatric: Patient is competent for consent with normal mood and affect Lymphatic: no lymphedema  MUSCULOSKELETAL: exam stable  Assessment: Right Open Pilon Fracture  Plan: Plan for  Procedure(s): OPEN REDUCTION INTERNAL FIXATION (ORIF) RIGHT PILON FRACTURE REMOVAL EXTERNAL FIXATION RIGHT PILON FRACTURE  The risks benefits and alternatives were discussed with the patient including but not limited to the risks of nonoperative treatment, versus surgical intervention including infection, bleeding, nerve injury,  blood clots, cardiopulmonary complications, morbidity, mortality, among others, and they were willing to proceed.   Cheral AlmasXu, Mylinda Brook Michael, MD   12/29/2014 7:29 AM

## 2014-12-29 NOTE — Discharge Instructions (Signed)
° ° °  1. Keep splint clean and dry °2. Elevate foot above level of the heart °3. Take aspirin to prevent blood clots °4. Take pain meds as needed °5. Strict non weight bearing to operative extremity ° °

## 2014-12-29 NOTE — Anesthesia Postprocedure Evaluation (Signed)
Anesthesia Post Note  Patient: Jermaine Morgan  Procedure(s) Performed: Procedure(s) (LRB): OPEN REDUCTION INTERNAL FIXATION (ORIF) RIGHT PILON FRACTURE (Right) REMOVAL EXTERNAL FIXATION RIGHT PILON FRACTURE (Right)  Anesthesia type: general  Patient location: PACU  Post pain: Pain level controlled  Post assessment: Patient's Cardiovascular Status Stable  Last Vitals:  Filed Vitals:   12/29/14 1741  BP:   Pulse:   Temp: 36.6 C  Resp:     Post vital signs: Reviewed and stable  Level of consciousness: sedated  Complications: No apparent anesthesia complications

## 2014-12-29 NOTE — Anesthesia Preprocedure Evaluation (Addendum)
Anesthesia Evaluation  Patient identified by MRN, date of birth, ID band Patient awake    Reviewed: Allergy & Precautions, NPO status , Patient's Chart, lab work & pertinent test results  History of Anesthesia Complications (+) DIFFICULT AIRWAY and history of anesthetic complications  Airway Mallampati: II  TM Distance: >3 FB Neck ROM: Full    Dental no notable dental hx. (+) Dental Advisory Given   Pulmonary neg pulmonary ROS,    Pulmonary exam normal breath sounds clear to auscultation       Cardiovascular negative cardio ROS Normal cardiovascular exam Rhythm:Regular Rate:Normal     Neuro/Psych negative neurological ROS  negative psych ROS   GI/Hepatic negative GI ROS, Neg liver ROS,   Endo/Other  obesity  Renal/GU negative Renal ROS  negative genitourinary   Musculoskeletal negative musculoskeletal ROS (+)   Abdominal   Peds negative pediatric ROS (+)  Hematology negative hematology ROS (+)   Anesthesia Other Findings H/O difficult intubation secondary to neck immobility (C Collar).    Reproductive/Obstetrics negative OB ROS                          Anesthesia Physical Anesthesia Plan  ASA: I  Anesthesia Plan: General   Post-op Pain Management: GA combined w/ Regional for post-op pain   Induction: Intravenous  Airway Management Planned: LMA  Additional Equipment:   Intra-op Plan:   Post-operative Plan: Extubation in OR  Informed Consent: I have reviewed the patients History and Physical, chart, labs and discussed the procedure including the risks, benefits and alternatives for the proposed anesthesia with the patient or authorized representative who has indicated his/her understanding and acceptance.   Dental advisory given  Plan Discussed with: CRNA  Anesthesia Plan Comments:         Anesthesia Quick Evaluation

## 2014-12-29 NOTE — Transfer of Care (Signed)
Immediate Anesthesia Transfer of Care Note  Patient: Jermaine Morgan  Procedure(s) Performed: Procedure(s): OPEN REDUCTION INTERNAL FIXATION (ORIF) RIGHT PILON FRACTURE (Right) REMOVAL EXTERNAL FIXATION RIGHT PILON FRACTURE (Right)  Patient Location: PACU  Anesthesia Type:GA combined with regional for post-op pain  Level of Consciousness: awake, alert , oriented and patient cooperative  Airway & Oxygen Therapy: Patient Spontanous Breathing and Patient connected to nasal cannula oxygen  Post-op Assessment: Report given to RN and Post -op Vital signs reviewed and stable  Post vital signs: Reviewed and stable  Last Vitals:  Filed Vitals:   12/29/14 1630  BP:   Pulse: 102  Temp:   Resp: 12    Complications: No apparent anesthesia complications

## 2014-12-29 NOTE — Anesthesia Procedure Notes (Addendum)
Anesthesia Regional Block:  Popliteal block  Pre-Anesthetic Checklist: ,, timeout performed, Correct Patient, Correct Site, Correct Laterality, Correct Procedure, Correct Position, site marked, Risks and benefits discussed,  Surgical consent,  Pre-op evaluation,  At surgeon's request and post-op pain management  Laterality: Right  Prep: chloraprep       Needles:  Injection technique: Single-shot  Needle Type: Sprotte          Additional Needles:  Procedures: ultrasound guided (picture in chart) and nerve stimulator Popliteal block Narrative:  Injection made incrementally with aspirations every 5 mL.  Performed by: Personally  Anesthesiologist: JUDD, MARY  Additional Notes: Risks, benefits and alternative to block explained extensively.  Patient tolerated procedure well, without complications.   Procedure Name: LMA Insertion Date/Time: 12/29/2014 2:17 PM Performed by: Fabian NovemberSOLHEIM, Khiyan Crace SALOMAN Pre-anesthesia Checklist: Patient identified, Emergency Drugs available, Suction available, Patient being monitored and Timeout performed Patient Re-evaluated:Patient Re-evaluated prior to inductionOxygen Delivery Method: Circle system utilized Preoxygenation: Pre-oxygenation with 100% oxygen Intubation Type: IV induction Ventilation: Mask ventilation without difficulty LMA: LMA with gastric port inserted LMA Size: 4.0 Number of attempts: 1 Placement Confirmation: positive ETCO2 and breath sounds checked- equal and bilateral Tube secured with: Tape Dental Injury: Teeth and Oropharynx as per pre-operative assessment

## 2014-12-29 NOTE — Progress Notes (Signed)
Orthopedic Tech Progress Note Patient Details:  Jermaine Morgan 11/04/1980 [id number]  Ortho Devices Ortho Device/Splint Location: applied ohf to bed Ortho Device/Splint Interventions: Ordered, Application   Jennye MoccasinHughes, Rithwik Schmieg Craig 12/29/2014, 8:55 PM

## 2014-12-30 ENCOUNTER — Encounter (HOSPITAL_COMMUNITY): Payer: Self-pay | Admitting: Orthopaedic Surgery

## 2014-12-30 DIAGNOSIS — S82871C Displaced pilon fracture of right tibia, initial encounter for open fracture type IIIA, IIIB, or IIIC: Secondary | ICD-10-CM | POA: Diagnosis not present

## 2014-12-30 NOTE — Progress Notes (Addendum)
   Subjective:  Patient reports pain as mild.    Objective:   VITALS:   Filed Vitals:   12/29/14 1830 12/29/14 2221 12/30/14 0227 12/30/14 0516  BP: 140/79 134/64 130/65 130/64  Pulse: 86 85 86 87  Temp: 98.4 F (36.9 C) 98 F (36.7 C) 97.9 F (36.6 C) 98.7 F (37.1 C)  TempSrc: Oral Oral Oral Oral  Resp: 14 15 15 16   Height:      Weight:      SpO2: 97% 97% 96% 95%    Neurologically intact Neurovascular intact Sensation intact distally Intact pulses distally Incision: dressing C/D/I and no drainage No cellulitis present Compartment soft   Lab Results  Component Value Date   WBC 8.5 12/18/2014   HGB 13.3 12/18/2014   HCT 40.3 12/18/2014   MCV 82.6 12/18/2014   PLT 201 12/18/2014     Assessment/Plan:  1 Day Post-Op   - DVT ppx - SCDs, ambulation, aspirin - NWB operative extremity - home this am - patient does not need PT as he has already cleared PT with crutches from prior admission - HVAC removed   Cheral AlmasXu, Chandy Tarman Michael 12/30/2014, 7:38 AM [phone]

## 2014-12-30 NOTE — Discharge Summary (Signed)
Physician Discharge Summary      Patient ID: Jermaine Morgan MRN: [id number] DOB/AGE: 35-Dec-1982 35 y.o.  Admit date: 12/29/2014 Discharge date: 12/30/2014  Admission Diagnoses:  <principal problem not specified>  Discharge Diagnoses:  Active Problems:   Open pilon fracture   Past Medical History  Diagnosis Date  . Open pilon fracture     right    Surgeries: Procedure(s): OPEN REDUCTION INTERNAL FIXATION (ORIF) RIGHT PILON FRACTURE REMOVAL EXTERNAL FIXATION RIGHT PILON FRACTURE on 12/29/2014   Consultants (if any):    Discharged Condition: Improved  Hospital Course: Joaopedro Eschbach is an 34 y.o. male who was admitted 12/29/2014 with a diagnosis of <principal problem not specified> and went to the operating room on 12/29/2014 and underwent the above named procedures.    He was given perioperative antibiotics:  Anti-infectives    Start     Dose/Rate Route Frequency Ordered Stop   12/29/14 2000  ceFAZolin (ANCEF) IVPB 2 g/50 mL premix     2 g 100 mL/hr over 30 Minutes Intravenous Every 6 hours 12/29/14 1814 12/30/14 1359    .  He was given sequential compression devices, early ambulation, and aspirin for DVT prophylaxis.  He benefited maximally from the hospital stay and there were no complications.    Recent vital signs:  Filed Vitals:   12/30/14 0516  BP: 130/64  Pulse: 87  Temp: 98.7 F (37.1 C)  Resp: 16    Recent laboratory studies:  Lab Results  Component Value Date   HGB 13.3 12/18/2014   HGB 15.6 12/17/2014   HGB 13.7 12/17/2014   Lab Results  Component Value Date   WBC 8.5 12/18/2014   PLT 201 12/18/2014   Lab Results  Component Value Date   INR 1.06 12/17/2014   Lab Results  Component Value Date   NA 137 12/20/2014   K 3.4* 12/20/2014   CL 99* 12/20/2014   CO2 28 12/20/2014   BUN 12 12/20/2014   CREATININE 0.65 12/20/2014   GLUCOSE 110* 12/20/2014    Discharge Medications:     Medication List    TAKE these medications         aspirin EC 325 MG tablet  Take 1 tablet (325 mg total) by mouth 2 (two) times daily.     methocarbamol 750 MG tablet  Commonly known as:  ROBAXIN  Take 1 tablet (750 mg total) by mouth 2 (two) times daily as needed for muscle spasms.     oxyCODONE 10 mg 12 hr tablet  Commonly known as:  OXYCONTIN  Take 1 tablet (10 mg total) by mouth every 12 (twelve) hours.     oxyCODONE-acetaminophen 5-325 MG tablet  Commonly known as:  PERCOCET  Take 1-2 tablets by mouth every 4 (four) hours as needed for severe pain.     senna-docusate 8.6-50 MG tablet  Commonly known as:  SENOKOT S  Take 1 tablet by mouth at bedtime as needed.        Diagnostic Studies: Dg Knee 2 Views Right  12/17/2014  CLINICAL DATA:  Fall with right knee pain.  Initial encounter. EXAM: RIGHT KNEE - 1-2 VIEW COMPARISON:  None. FINDINGS: There is no evidence of fracture, dislocation, or joint effusion. There is no evidence of arthropathy or other focal bone abnormality. Soft tissues are unremarkable. IMPRESSION: Negative. Electronically Signed   By: Irish Lack M.D.   On: 12/17/2014 11:54   Dg Tibia/fibula Right  12/29/2014  CLINICAL DATA:  Patient status post ORIF for distal tibia  fracture. EXAM: DG C-ARM 61-120 MIN; RIGHT TIBIA AND FIBULA - 2 VIEW COMPARISON:  12/17/2014 radiographs. FINDINGS: Plate and screw fixation is demonstrated of the distal tibia. There is a persistent comminuted fracture of the distal fibula. Three intraoperative fluoroscopic images were submitted for interpretation. IMPRESSION: Patient status post plate and screw fixation of distal tibia fracture. Electronically Signed   By: Annia Belt M.D.   On: 12/29/2014 16:00   Dg Tibia/fibula Right  12/17/2014  CLINICAL DATA:  Fall off of roof with comminuted fractures of the distal tibia and fibula. Subsequent encounter. EXAM: RIGHT TIBIA AND FIBULA - 2 VIEW COMPARISON:  X-rays and CT from earlier today. FINDINGS: Degree displacement of the distal  fibular and tibial fractures appears slightly improved. The tibial fracture again appears to be an open fracture. IMPRESSION: Slightly improved alignment at the level of right distal fibular and tibial fractures. Electronically Signed   By: Irish Lack M.D.   On: 12/17/2014 11:59   Dg Ankle 2 Views Right  12/17/2014  CLINICAL DATA:  Distal tibial and fibular fractures EXAM: DG C-ARM 61-120 MIN; RIGHT ANKLE - 2 VIEW COMPARISON:  Radiographs from earlier the same day FINDINGS: 6 fluoroscopic spot images document placement of external fixation hardware in the proximal tibial shaft and calcaneus, across comminuted distal tibial and fibular shaft fractures. IMPRESSION: External fixation of distal right tibial and fibular shaft fractures. Electronically Signed   By: Corlis Leak M.D.   On: 12/17/2014 15:09   Ct Head Wo Contrast  12/17/2014  CLINICAL DATA:  34 year old male with head and cervical spine injury following 15 foot fall. Initial encounter. EXAM: CT HEAD WITHOUT CONTRAST CT CERVICAL SPINE WITHOUT CONTRAST TECHNIQUE: Multidetector CT imaging of the head and cervical spine was performed following the standard protocol without intravenous contrast. Multiplanar CT image reconstructions of the cervical spine were also generated. COMPARISON:  None. FINDINGS: CT HEAD FINDINGS No intracranial abnormalities are identified, including mass lesion or mass effect, hydrocephalus, extra-axial fluid collection, midline shift, hemorrhage, or acute infarction. The visualized bony calvarium is unremarkable. CT CERVICAL SPINE FINDINGS Normal alignment is noted. There is no evidence of acute fracture, subluxation or prevertebral soft tissue swelling. The disc spaces are maintained. No focal bony lesions are identified. The soft tissue structures and lung apices are unremarkable. IMPRESSION: Unremarkable CT of the head and cervical spine. Electronically Signed   By: Harmon Pier M.D.   On: 12/17/2014 11:12   Ct Chest W  Contrast  12/17/2014  CLINICAL DATA:  34 year old male status post fall from 15 feet with neck and back pain. EXAM: CT CHEST, ABDOMEN, AND PELVIS WITH CONTRAST TECHNIQUE: Multidetector CT imaging of the chest, abdomen and pelvis was performed following the standard protocol during bolus administration of intravenous contrast. CONTRAST:  OMNIPAQUE IOHEXOL 300 MG/ML  SOLN COMPARISON:  Concurrently obtained CT scans of the head and cervical spine FINDINGS: CT CHEST FINDINGS Mediastinum/Nodes: Unremarkable CT appearance of the thyroid gland. No suspicious mediastinal or hilar adenopathy. No soft tissue mediastinal mass. The thoracic esophagus is unremarkable. Lungs/Pleura: Conventional 3 vessel arch anatomy. No evidence of traumatic dissection. The heart is within normal limits for size. No pericardial effusion. Unremarkable main pulmonary vein. Musculoskeletal: No acute fracture or aggressive appearing lytic or blastic osseous lesion. CT ABDOMEN PELVIS FINDINGS Hepatobiliary: Normal hepatic contour morphology. No evidence of discrete lesion or injury. The portal veins are widely patent. Gallbladder is unremarkable. No intra or extrahepatic biliary ductal dilatation. Pancreas: Unremarkable.  No evidence of injury or  mass lesion. Spleen: Normal appearance.  No evidence of acute injury. Adrenals/Urinary Tract: The bilateral adrenal glands are normal. No evidence of hemorrhage or nodule. Both kidneys are normal in appearance. No evidence of nephrolithiasis, hydronephrosis or acute injury. Stomach/Bowel: No evidence of obstruction or focal bowel wall thickening. Normal appendix in the right lower quadrant. The terminal ileum is unremarkable. Unremarkable CT appearance of the stomach and duodenum. Vascular/Lymphatic: No significant atherosclerotic vascular disease, aneurysmal dilatation or acute abnormality. No suspicious adenopathy or evidence of free fluid. Reproductive: Unremarkable bladder, prostate gland and  seminal vesicles. No free fluid or suspicious adenopathy. Other: None Musculoskeletal: No acute fracture or aggressive appearing lytic or blastic osseous lesion. IMPRESSION: CT CHEST 1. No evidence of acute injury to the thorax. 2. Unremarkable CT of the chest. CT ABD/PELVIS 1. No evidence of acute injury involving the abdomen or pelvis. 2. Unremarkable CT scan of the abdomen and pelvis. Electronically Signed   By: Malachy Moan M.D.   On: 12/17/2014 11:18   Ct Cervical Spine Wo Contrast  12/17/2014  CLINICAL DATA:  34 year old male with head and cervical spine injury following 15 foot fall. Initial encounter. EXAM: CT HEAD WITHOUT CONTRAST CT CERVICAL SPINE WITHOUT CONTRAST TECHNIQUE: Multidetector CT imaging of the head and cervical spine was performed following the standard protocol without intravenous contrast. Multiplanar CT image reconstructions of the cervical spine were also generated. COMPARISON:  None. FINDINGS: CT HEAD FINDINGS No intracranial abnormalities are identified, including mass lesion or mass effect, hydrocephalus, extra-axial fluid collection, midline shift, hemorrhage, or acute infarction. The visualized bony calvarium is unremarkable. CT CERVICAL SPINE FINDINGS Normal alignment is noted. There is no evidence of acute fracture, subluxation or prevertebral soft tissue swelling. The disc spaces are maintained. No focal bony lesions are identified. The soft tissue structures and lung apices are unremarkable. IMPRESSION: Unremarkable CT of the head and cervical spine. Electronically Signed   By: Harmon Pier M.D.   On: 12/17/2014 11:12   Ct Abdomen Pelvis W Contrast  12/17/2014  CLINICAL DATA:  34 year old male status post fall from 15 feet with neck and back pain. EXAM: CT CHEST, ABDOMEN, AND PELVIS WITH CONTRAST TECHNIQUE: Multidetector CT imaging of the chest, abdomen and pelvis was performed following the standard protocol during bolus administration of intravenous contrast.  CONTRAST:  OMNIPAQUE IOHEXOL 300 MG/ML  SOLN COMPARISON:  Concurrently obtained CT scans of the head and cervical spine FINDINGS: CT CHEST FINDINGS Mediastinum/Nodes: Unremarkable CT appearance of the thyroid gland. No suspicious mediastinal or hilar adenopathy. No soft tissue mediastinal mass. The thoracic esophagus is unremarkable. Lungs/Pleura: Conventional 3 vessel arch anatomy. No evidence of traumatic dissection. The heart is within normal limits for size. No pericardial effusion. Unremarkable main pulmonary vein. Musculoskeletal: No acute fracture or aggressive appearing lytic or blastic osseous lesion. CT ABDOMEN PELVIS FINDINGS Hepatobiliary: Normal hepatic contour morphology. No evidence of discrete lesion or injury. The portal veins are widely patent. Gallbladder is unremarkable. No intra or extrahepatic biliary ductal dilatation. Pancreas: Unremarkable.  No evidence of injury or mass lesion. Spleen: Normal appearance.  No evidence of acute injury. Adrenals/Urinary Tract: The bilateral adrenal glands are normal. No evidence of hemorrhage or nodule. Both kidneys are normal in appearance. No evidence of nephrolithiasis, hydronephrosis or acute injury. Stomach/Bowel: No evidence of obstruction or focal bowel wall thickening. Normal appendix in the right lower quadrant. The terminal ileum is unremarkable. Unremarkable CT appearance of the stomach and duodenum. Vascular/Lymphatic: No significant atherosclerotic vascular disease, aneurysmal dilatation or acute abnormality.  No suspicious adenopathy or evidence of free fluid. Reproductive: Unremarkable bladder, prostate gland and seminal vesicles. No free fluid or suspicious adenopathy. Other: None Musculoskeletal: No acute fracture or aggressive appearing lytic or blastic osseous lesion. IMPRESSION: CT CHEST 1. No evidence of acute injury to the thorax. 2. Unremarkable CT of the chest. CT ABD/PELVIS 1. No evidence of acute injury involving the abdomen or  pelvis. 2. Unremarkable CT scan of the abdomen and pelvis. Electronically Signed   By: Malachy MoanHeath  McCullough M.D.   On: 12/17/2014 11:18   Ct Ankle Right Wo Contrast  12/17/2014  CLINICAL DATA:  Status post fall from 15 feet. EXAM: CT OF THE RIGHT ANKLE WITHOUT CONTRAST TECHNIQUE: Multidetector CT imaging of the right ankle was performed according to the standard protocol. Multiplanar CT image reconstructions were also generated. COMPARISON:  None. FINDINGS: There is a comminuted fracture of the distal tibial diametaphysis with 1.8 cm of lateral displacement, 1.4 cm of anterior displacement and 2 cm of overriding between the major fracture fragments along the medial aspect. There is a fracture cleft involving the anterior lip of the tibia. There is comminution of the lateral portion of the articular surface of the tibial plafond with 1-2 mm of depression. There is a open wound adjacent to the medial malleolus with soft tissue emphysema around the fracture site consistent with an open fracture. There is a nondisplaced fracture involving the medial malleolus. The distal tibiofibular joint is congruent. The ankle mortise is intact. There is a comminuted fracture of the distal fibular diaphysis with mild angulation of the major fracture fragment between the proximal and distal shaft. There is a fracture cleft partially visualized extending into the mid fibular diaphysis which is incompletely included in the field of view. Normal subtalar joints. No other fracture or dislocation. Mild osteoarthritis of the navicular-middle cuneiform. IMPRESSION: 1. Severely comminuted and displaced fracture of the distal tibial diametaphysis as detailed above. Fracture cleft involves the anterior lip of the tibia. Air seen around the fracture site with soft tissue wound over the medial malleolus consistent with an open fracture. 2. Comminution of the lateral portion of the articular surface of the tibial plafond with 1-2 mm of  depression. 3. Nondisplaced fracture of the medial malleolus. 4. Comminuted fracture of the distal fibular diaphysis with mild angulation of the fracture fragment situated between the proximal and distal shafts. fracture cleft partially visualized extending into the mid fibular diaphysis which is incompletely included in the field of view. Electronically Signed   By: Elige KoHetal  Patel   On: 12/17/2014 11:17   Dg Pelvis Portable  12/17/2014  CLINICAL DATA:  Larey SeatFell off roof wall working. EXAM: PORTABLE PELVIS 1-2 VIEWS COMPARISON:  None. FINDINGS: There is no evidence of pelvic fracture or diastasis. No pelvic bone lesions are seen. IMPRESSION: Normal pelvis. Electronically Signed   By: Lupita RaiderJames  Green Jr, M.D.   On: 12/17/2014 09:48   Dg Chest Portable 1 View  12/17/2014  CLINICAL DATA:  Larey SeatFell off roof while working. EXAM: PORTABLE CHEST 1 VIEW COMPARISON:  None. FINDINGS: The heart size and mediastinal contours are within normal limits. Both lungs are clear. No pneumothorax or pleural effusion is noted. The visualized skeletal structures are unremarkable. IMPRESSION: No acute cardiopulmonary abnormality seen. Electronically Signed   By: Lupita RaiderJames  Green Jr, M.D.   On: 12/17/2014 09:46   Dg Tibia/fibula Right Port  12/29/2014  CLINICAL DATA:  Status post ORIF of distal right tibial fracture EXAM: PORTABLE RIGHT TIBIA AND FIBULA - 2 VIEW  COMPARISON:  Intraoperative films from earlier in the same day, 12/17/2014 FINDINGS: A long fixation sideplate is noted along the anterior and medial aspect of the right tibia. The fracture fragments are in near anatomic alignment. The previously seen external fixator is been removed. Comminuted fracture of the distal fibula is stable in appearance. IMPRESSION: Status post ORIF of distal right tibial fracture Electronically Signed   By: Alcide Clever M.D.   On: 12/29/2014 17:05   Dg Ankle Right Port  12/17/2014  CLINICAL DATA:  Open ankle fracture from falling off bruit while working.  EXAM: PORTABLE RIGHT ANKLE - 2 VIEW COMPARISON:  None. FINDINGS: Examination demonstrates a displaced slightly comminuted fracture of the distal tibial diametaphyseal region extending to the articular surface. There is approximately 1 shaft's with of medial displacement and 1/2 shaft's with posterior displacement of the proximal fragment. There is a comminuted displaced fracture of the distal fibular diaphysis approximately 4 cm above the ankle mortise. Linear sclerosis over the posterior plantar aspect of the calcaneus as cannot exclude a subtle impaction fracture. IMPRESSION: Moderately displaced and minimally comminuted fracture of the distal tibial diametaphyseal region with extension to the articular surface. Displaced comminuted fracture of the distal fibular diaphysis. Possible nondisplaced impaction fracture of the posterior calcaneus. Electronically Signed   By: Elberta Fortis M.D.   On: 12/17/2014 09:55   Dg C-arm 1-60 Min  12/29/2014  CLINICAL DATA:  Patient status post ORIF for distal tibia fracture. EXAM: DG C-ARM 61-120 MIN; RIGHT TIBIA AND FIBULA - 2 VIEW COMPARISON:  12/17/2014 radiographs. FINDINGS: Plate and screw fixation is demonstrated of the distal tibia. There is a persistent comminuted fracture of the distal fibula. Three intraoperative fluoroscopic images were submitted for interpretation. IMPRESSION: Patient status post plate and screw fixation of distal tibia fracture. Electronically Signed   By: Annia Belt M.D.   On: 12/29/2014 16:00   Dg C-arm 1-60 Min  12/17/2014  CLINICAL DATA:  Distal tibial and fibular fractures EXAM: DG C-ARM 61-120 MIN; RIGHT ANKLE - 2 VIEW COMPARISON:  Radiographs from earlier the same day FINDINGS: 6 fluoroscopic spot images document placement of external fixation hardware in the proximal tibial shaft and calcaneus, across comminuted distal tibial and fibular shaft fractures. IMPRESSION: External fixation of distal right tibial and fibular shaft fractures.  Electronically Signed   By: Corlis Leak M.D.   On: 12/17/2014 15:09   Dg Femur, Min 2 Views Right  12/17/2014  CLINICAL DATA:  Patient status post fall from roof. Initial encounter. EXAM: RIGHT FEMUR 2 VIEWS COMPARISON:  None. FINDINGS: Two AP images of the femur were submitted. Additionally there is a lateral image of the distal femur. The entire femur is not visualized on lateral image. No evidence for displaced fracture. IMPRESSION: Limited exam as the entire femur was not able to be visualized in two views. No evidence for displaced fracture. Consider followup radiograph when patient clinically able to assess the entire femur in two views. Electronically Signed   By: Annia Belt M.D.   On: 12/17/2014 11:57    Disposition: 01-Home or Self Care      Discharge Instructions    Call MD / Call 911    Complete by:  As directed   If you experience chest pain or shortness of breath, CALL 911 and be transported to the hospital emergency room.  If you develope a fever above 101.5 F, pus (white drainage) or increased drainage or redness at the wound, or calf pain, call your surgeon's  office.     Constipation Prevention    Complete by:  As directed   Drink plenty of fluids.  Prune juice may be helpful.  You may use a stool softener, such as Colace (over the counter) 100 mg twice a day.  Use MiraLax (over the counter) for constipation as needed.     Diet - low sodium heart healthy    Complete by:  As directed      Diet general    Complete by:  As directed      Driving restrictions    Complete by:  As directed   No driving while taking narcotic pain meds.     Increase activity slowly as tolerated    Complete by:  As directed            Follow-up Information    Follow up with Cheral Almas, MD In 2 weeks.   Specialty:  Orthopedic Surgery   Why:  For suture removal, For wound re-check   Contact information:   [address] [place] [address] [phone]         Signed: Cheral Almas 12/30/2014, 7:40 AM

## 2014-12-30 NOTE — Progress Notes (Signed)
Jermaine Morgan to be D/C'd home per MD order. Discussed with the patient and all questions fully answered.  VSS, Hard cast in place to right lower extremity.  Patient has wheelchair and crutches for home use.  IV catheter discontinued intact. Site without signs and symptoms of complications. Dressing and pressure applied.  An After Visit Summary was printed and given to the patient. Patient received prescriptions.  D/c education completed with patient/family including follow up instructions, medication list, d/c activities limitations if indicated, with other d/c instructions as indicated by MD - patient able to verbalize understanding, all questions fully answered.   Patient instructed to return to ED, call 911, or call MD for any changes in condition.   Patient to be escorted via WC, and D/C home via private auto.

## 2015-01-03 ENCOUNTER — Encounter (HOSPITAL_COMMUNITY): Payer: Self-pay | Admitting: Orthopaedic Surgery

## 2017-03-01 ENCOUNTER — Emergency Department (HOSPITAL_COMMUNITY): Payer: Self-pay

## 2017-03-01 ENCOUNTER — Encounter (HOSPITAL_COMMUNITY): Payer: Self-pay | Admitting: Emergency Medicine

## 2017-03-01 ENCOUNTER — Emergency Department (HOSPITAL_COMMUNITY)
Admission: EM | Admit: 2017-03-01 | Discharge: 2017-03-01 | Disposition: A | Discharge status: Home / Self Care | Payer: Self-pay | Attending: Emergency Medicine | Admitting: Emergency Medicine

## 2017-03-01 ENCOUNTER — Other Ambulatory Visit: Payer: Self-pay

## 2017-03-01 DIAGNOSIS — R1013 Epigastric pain: Secondary | ICD-10-CM | POA: Insufficient documentation

## 2017-03-01 DIAGNOSIS — Z7982 Long term (current) use of aspirin: Secondary | ICD-10-CM | POA: Insufficient documentation

## 2017-03-01 LAB — URINALYSIS, ROUTINE W REFLEX MICROSCOPIC
Bacteria, UA: NONE SEEN
Bilirubin Urine: NEGATIVE
Glucose, UA: NEGATIVE mg/dL
Hgb urine dipstick: NEGATIVE
Ketones, ur: NEGATIVE mg/dL
Leukocytes, UA: NEGATIVE
Nitrite: NEGATIVE
Protein, ur: NEGATIVE mg/dL
Specific Gravity, Urine: 1.023 (ref 1.005–1.030)
Squamous Epithelial / LPF: NONE SEEN
pH: 7 (ref 5.0–8.0)

## 2017-03-01 LAB — LIPASE, BLOOD: Lipase: 29 U/L (ref 11–51)

## 2017-03-01 LAB — CBC
HCT: 45.9 % (ref 39.0–52.0)
Hemoglobin: 14.7 g/dL (ref 13.0–17.0)
MCH: 26.6 pg (ref 26.0–34.0)
MCHC: 32 g/dL (ref 30.0–36.0)
MCV: 83.2 fL (ref 78.0–100.0)
Platelets: 216 10*3/uL (ref 150–400)
RBC: 5.52 MIL/uL (ref 4.22–5.81)
RDW: 13.1 % (ref 11.5–15.5)
WBC: 10.6 10*3/uL — ABNORMAL HIGH (ref 4.0–10.5)

## 2017-03-01 LAB — COMPREHENSIVE METABOLIC PANEL
ALT: 39 U/L (ref 17–63)
AST: 34 U/L (ref 15–41)
Albumin: 3.9 g/dL (ref 3.5–5.0)
Alkaline Phosphatase: 88 U/L (ref 38–126)
Anion gap: 10 (ref 5–15)
BUN: 12 mg/dL (ref 6–20)
CO2: 22 mmol/L (ref 22–32)
Calcium: 9.1 mg/dL (ref 8.9–10.3)
Chloride: 103 mmol/L (ref 101–111)
Creatinine, Ser: 0.64 mg/dL (ref 0.61–1.24)
GFR calc Af Amer: >60 mL/min (ref >60)
GFR calc non Af Amer: >60 mL/min (ref >60)
Glucose, Bld: 147 mg/dL — ABNORMAL HIGH (ref 65–99)
Potassium: 4 mmol/L (ref 3.5–5.1)
Sodium: 135 mmol/L (ref 135–145)
Total Bilirubin: 0.9 mg/dL (ref 0.3–1.2)
Total Protein: 7.6 g/dL (ref 6.5–8.1)

## 2017-03-01 MED ORDER — OMEPRAZOLE 20 MG PO CPDR: 20.0000 mg | DELAYED_RELEASE_CAPSULE | Freq: Two times a day (BID) | ORAL | 0 refills | Status: DC

## 2017-03-01 MED ORDER — GI COCKTAIL ~~LOC~~
30.0000 mL | Freq: Once | ORAL | Status: AC
  Administered 2017-03-01: 30 mL via ORAL
  Filled 2017-03-01: qty 30

## 2017-03-01 NOTE — Discharge Instructions (Addendum)
Your evaluation today is reassuring x-ray shows no evidence of perforated ulcer, and lab work looks good.  Pain likely due to peptic ulcer disease, gastritis or acid reflux.  Please begin taking omeprazole twice a day at least 30 minutes before breakfast and dinner.  You will need to follow-up with your primary doctor, as well as schedule follow-up with Dr. Audley HoseHong with gastroenterology for continued evaluation of these epigastric pain.  If you have significantly worsening pain, nausea and vomiting and or unable to keep down fluids, fever, blood in the stool or dark black stools, or your abdomen becomes hard and distended please return to the ED for reevaluation.

## 2017-03-01 NOTE — ED Provider Notes (Signed)
[facility] EMERGENCY DEPARTMENT Provider Note   CSN: [id number] Arrival date & time: 03/01/17  0538     History   Chief Complaint Chief Complaint  Patient presents with  . Abdominal Pain    HPI Jermaine Morgan is a 37 y.o. male.  Jermaine Morgan is a 37 y.o. Male otherwise healthy, presents to the ED for evaluation of epigastric abdominal pain.  Patient reports he has had this pain intermittently for the past year, especially at night and after he eats greasy or spicy foods.  But reports the pain got so bad last night that it was unbearable and was not relieving.  Patient has not seen any providers providing this pain and has not tried any medications to treat his symptoms.  Patient reports some associated nausea, no episodes of vomiting, no diarrhea, no blood in the stool or dark tarry stools.  He denies any fevers or chills.  No associated dysuria.  Patient denies chest pain or shortness of breath.  Patient reports now upon evaluation, pain has improved drastically and he is much more comfortable.       Past Medical History:  Diagnosis Date  . Open pilon fracture    right    Patient Active Problem List   Diagnosis Date Noted  . Open pilon fracture 12/17/2014    Past Surgical History:  Procedure Laterality Date  . EXTERNAL FIXATION REMOVAL Right 12/29/2014   Procedure: REMOVAL EXTERNAL FIXATION RIGHT PILON FRACTURE;  Surgeon: Tarry Kos, MD;  [facility]: [facility];  Service: Orthopedics;  Laterality: Right;  . ORIF ANKLE FRACTURE Right 12/29/2014   Procedure: OPEN REDUCTION INTERNAL FIXATION (ORIF) RIGHT PILON FRACTURE;  Surgeon: Tarry Kos, MD;  Location: [facility];  Service: Orthopedics;  Laterality: Right;  . TIBIA IM NAIL INSERTION Right 12/17/2014   Procedure: INTRAMEDULLARY (IM) NAIL TIBIAL ,  I AND D TIBIA;  Surgeon: Tarry Kos, MD;  Location: [facility];  Service: Orthopedics;  Laterality: Right;  Irrigation and debridement with external fixator  placement of right open ankle injury        Home Medications    Prior to Admission medications   Medication Sig Start Date End Date Taking? Authorizing Provider  aspirin EC 325 MG tablet Take 1 tablet (325 mg total) by mouth 2 (two) times daily. 12/29/14   Tarry Kos, MD  methocarbamol (ROBAXIN) 750 MG tablet Take 1 tablet (750 mg total) by mouth 2 (two) times daily as needed for muscle spasms. 12/29/14   Tarry Kos, MD  oxyCODONE (OXYCONTIN) 10 mg 12 hr tablet Take 1 tablet (10 mg total) by mouth every 12 (twelve) hours. 12/29/14   Tarry Kos, MD  oxyCODONE-acetaminophen (PERCOCET) 5-325 MG tablet Take 1-2 tablets by mouth every 4 (four) hours as needed for severe pain. 12/29/14   Tarry Kos, MD  senna-docusate (SENOKOT S) 8.6-50 MG tablet Take 1 tablet by mouth at bedtime as needed. 12/29/14   Tarry Kos, MD    Family History No family history on file.  Social History Social History   Tobacco Use  . Smoking status: Never Smoker  . Smokeless tobacco: Never Used  Substance Use Topics  . Alcohol use: No  . Drug use: No     Allergies   Patient has no known allergies.   Review of Systems Review of Systems  Constitutional: Negative for chills and fever.  HENT: Negative for congestion, rhinorrhea and sore throat.   Eyes: Negative for discharge,  redness and itching.  Respiratory: Negative for cough, shortness of breath and wheezing.   Gastrointestinal: Positive for abdominal pain and nausea. Negative for diarrhea and vomiting.  Genitourinary: Negative for dysuria.  Musculoskeletal: Negative for back pain and myalgias.  Skin: Negative for rash.  Neurological: Negative for dizziness, light-headedness and headaches.     Physical Exam Updated Vital Signs BP 133/67   Pulse 70   Temp 98.8 F (37.1 C) (Oral)   Resp 18   SpO2 100%   Physical Exam  Constitutional: He appears well-developed and well-nourished.  Non-toxic appearance. He does not appear ill. No  distress.  HENT:  Head: Normocephalic and atraumatic.  Eyes: Right eye exhibits no discharge. Left eye exhibits no discharge.  Neck: Neck supple.  Cardiovascular: Normal rate, regular rhythm, normal heart sounds and intact distal pulses.  Pulmonary/Chest: Effort normal and breath sounds normal. No stridor. No respiratory distress. He has no wheezes. He has no rales.  Abdominal: Soft. Bowel sounds are normal. He exhibits no distension and no mass. There is tenderness. There is no rebound and no guarding.  Mild epigastric tenderness, without guarding, all other quadrants NTTP, no peritoneal signs  Musculoskeletal: He exhibits no edema or deformity.  Neurological: He is alert. Coordination normal.  Skin: Skin is warm and dry. Capillary refill takes less than 2 seconds. He is not diaphoretic.  Psychiatric: He has a normal mood and affect. His behavior is normal.  Nursing note and vitals reviewed.    ED Treatments / Results  Labs (all labs ordered are listed, but only abnormal results are displayed) Labs Reviewed  COMPREHENSIVE METABOLIC PANEL - Abnormal; Notable for the following components:      Result Value   Glucose, Bld 147 (*)    All other components within normal limits  CBC - Abnormal; Notable for the following components:   WBC 10.6 (*)    All other components within normal limits  LIPASE, BLOOD  URINALYSIS, ROUTINE W REFLEX MICROSCOPIC    EKG  EKG Interpretation  Date/Time:  Friday March 01 2017 05:43:52 EST Ventricular Rate:  74 PR Interval:  148 QRS Duration: 100 QT Interval:  382 QTC Calculation: 424 R Axis:   97 Text Interpretation:  Normal sinus rhythm Rightward axis Borderline ECG No old tracing to compare Confirmed by Raeford Razor [phone]) on 03/01/2017 2:07:51 PM       Radiology Dg Abdomen 1 View  Result Date: 03/01/2017 CLINICAL DATA:  Abdominal pain for a few weeks, nausea this week EXAM: ABDOMEN - 1 VIEW COMPARISON:  CT abdomen and pelvis 12/17/2014  FINDINGS: Normal bowel gas pattern. No bowel dilatation or bowel wall thickening. Osseous structures normal. No urinary tract calcifications. IMPRESSION: Normal exam. Electronically Signed   By: Ulyses Southward M.D.   On: 03/01/2017 11:35    Procedures Procedures (including critical care time)  Medications Ordered in ED Medications  gi cocktail (Maalox,Lidocaine,Donnatal) (30 mLs Oral Given 03/01/17 1142)     Initial Impression / Assessment and Plan / ED Course  I have reviewed the triage vital signs and the nursing notes.  Pertinent labs & imaging results that were available during my care of the patient were reviewed by me and considered in my medical decision making (see chart for details).  Patient presents complaining of epigastric abdominal pain, this has been present for a year but worsening last night.  Some associated nausea, no vomiting, hematochezia or melena.  No fevers.  On exam patient is well-appearing with normal vitals.  Mild  epigastric tenderness on exam, without guarding or distention.  Nontender in all other quadrants.  Exam otherwise nonfocal.  Likely gastritis, peptic ulcer disease or reflux.  Gwyndolyn Kaufmanorrey evaluation reassuring, no lupus ptosis, hemoglobin normal, no electrolyte derangements requiring intervention, kidney and liver function normal.  Normal lipase.  UA without evidence of infection.  Get abdominal x-ray to rule out any perforated ulcer, and give GI cocktail and reevaluate patient.  Pt reports pain is completely resolved after GI cocktail.  Abdominal x-ray shows no evidence of free air, normal bowel gas pattern.  Likely reflux, gastritis or ulcer.  Will start patient on twice daily PPI and have patient follow-up with GI as well as his PCP.  Return precautions discussed.  Patient expresses understanding and is in agreement with plan  Final Clinical Impressions(s) / ED Diagnoses   Final diagnoses:  Epigastric abdominal pain    ED Discharge Orders        Ordered      omeprazole (PRILOSEC) 20 MG capsule  2 times daily before meals     03/01/17 1316       Dartha LodgeFord, Licia Harl N, [place]PA-C 03/01/17 1409    Raeford RazorKohut, Stephen, MD 03/01/17 [phone]

## 2017-03-01 NOTE — ED Triage Notes (Signed)
Pt arrives to ED ambulatory by POV and states he has been having epigastric pain for one year states this pain feels "like a burning fire in top of stomach". Pt was also diaphoretic when he arrived temperature outside is in 30's. EKG done in [facility]. No n/v/d.

## 2018-01-24 ENCOUNTER — Ambulatory Visit: Payer: Self-pay | Admitting: Nurse Practitioner

## 2018-02-13 ENCOUNTER — Ambulatory Visit: Payer: Self-pay | Admitting: Family Medicine

## 2018-03-02 ENCOUNTER — Encounter (HOSPITAL_COMMUNITY): Payer: Self-pay

## 2018-03-02 ENCOUNTER — Ambulatory Visit (HOSPITAL_COMMUNITY)
Admission: EM | Admit: 2018-03-02 | Discharge: 2018-03-02 | Disposition: A | Discharge status: Home / Self Care | Payer: Self-pay | Attending: Family Medicine | Admitting: Family Medicine

## 2018-03-02 ENCOUNTER — Ambulatory Visit (INDEPENDENT_AMBULATORY_CARE_PROVIDER_SITE_OTHER): Payer: Self-pay

## 2018-03-02 DIAGNOSIS — K5900 Constipation, unspecified: Secondary | ICD-10-CM

## 2018-03-02 MED ORDER — LIDOCAINE VISCOUS HCL 2 % MT SOLN
OROMUCOSAL | Status: AC
  Filled 2018-03-02: qty 15

## 2018-03-02 MED ORDER — ALUM & MAG HYDROXIDE-SIMETH 200-200-20 MG/5ML PO SUSP
ORAL | Status: AC
  Filled 2018-03-02: qty 30

## 2018-03-02 MED ORDER — ALUM & MAG HYDROXIDE-SIMETH 200-200-20 MG/5ML PO SUSP
30.0000 mL | Freq: Once | ORAL | Status: AC
  Administered 2018-03-02: 30 mL via ORAL

## 2018-03-02 MED ORDER — LIDOCAINE VISCOUS HCL 2 % MT SOLN
15.0000 mL | Freq: Once | OROMUCOSAL | Status: AC
  Administered 2018-03-02: 15 mL via ORAL

## 2018-03-02 MED ORDER — POLYETHYLENE GLYCOL 3350 17 GM/SCOOP PO POWD: 1.0000 | Freq: Once | ORAL | 0 refills | Status: AC

## 2018-03-02 MED ORDER — OMEPRAZOLE 20 MG PO CPDR: 20.0000 mg | DELAYED_RELEASE_CAPSULE | Freq: Two times a day (BID) | ORAL | 0 refills | Status: DC

## 2018-03-02 NOTE — Discharge Instructions (Addendum)
Your x-ray showed a moderate amount of stool in the colon We will go ahead and treat you for constipation You can do 1 capful of MiraLAX twice a day until you have a good bowel movement You can then back off to once daily or once every couple of days I am also given you a GI cocktail here in clinic for the acid reflux You can continue with the omeprazole daily Make sure that you take the omeprazole 30- 60 minutes prior to a meal with a glass of water.  Avoid spicy, greasy foods, caffeine, chocolate and milk products.  No eating 2-3 hours before bedtime. Elevate the head of the bed 30 degrees.  Try this for a few weeks to see if this improves your symptoms.  If you don't see any improvement or your symptoms worsen please follow up with a GI

## 2018-03-02 NOTE — ED Provider Notes (Signed)
[facility]    CSN: [id number] Arrival date & time: 03/02/18  1441     History   Chief Complaint Chief Complaint  Patient presents with  . Abdominal Pain    HPI Jermaine Morgan is a 38 y.o. male.   Patient is a 38 year old male that is otherwise healthy.  He presents with upper abdominal discomfort that is been waxing and waning over the past couple months.  Food does increase his symptoms.  He describes it as gnawing.  He had one episode of vomiting on Friday.  He is also having some constipation.  He denies any blood in his stool.  He has not had a normal bowel movement since this past Friday.  He denies any chest pain, shortness of breath.  He denies any fevers, chills, night sweats, body aches. No recent traveling.  ROS per HPI      Past Medical History:  Diagnosis Date  . Open pilon fracture    right    Patient Active Problem List   Diagnosis Date Noted  . Open pilon fracture 12/17/2014    Past Surgical History:  Procedure Laterality Date  . EXTERNAL FIXATION REMOVAL Right 12/29/2014   Procedure: REMOVAL EXTERNAL FIXATION RIGHT PILON FRACTURE;  Surgeon: Tarry Kos, MD;  Location: [facility];  Service: Orthopedics;  Laterality: Right;  . ORIF ANKLE FRACTURE Right 12/29/2014   Procedure: OPEN REDUCTION INTERNAL FIXATION (ORIF) RIGHT PILON FRACTURE;  Surgeon: Tarry Kos, MD;  [facility];  Service: Orthopedics;  Laterality: Right;  . TIBIA IM NAIL INSERTION Right 12/17/2014   Procedure: INTRAMEDULLARY (IM) NAIL TIBIAL ,  I AND D TIBIA;  Surgeon: Tarry Kos, MD;  [facility];  Service: Orthopedics;  Laterality: Right;  Irrigation and debridement with external fixator placement of right open ankle injury        Home Medications    Prior to Admission medications   Medication Sig Start Date End Date Taking? Authorizing Provider  omeprazole (PRILOSEC) 20 MG capsule Take 1 capsule (20 mg total) by mouth 2 (two) times daily before a meal.  03/02/18   Kyeshia Zinn A, NP  polyethylene glycol powder (GLYCOLAX/MIRALAX) powder Take 255 g by mouth once for 1 dose. 03/02/18 03/02/18  Janace Aris, NP    Family History Family History  Problem Relation Age of Onset  . Healthy Mother   . Healthy Father     Social History Social History   Tobacco Use  . Smoking status: Never Smoker  . Smokeless tobacco: Never Used  Substance Use Topics  . Alcohol use: No  . Drug use: No     Allergies   Patient has no known allergies.   Review of Systems Review of Systems   Physical Exam Triage Vital Signs ED Triage Vitals  Enc Vitals Group     BP 03/02/18 1600 100/70     Pulse Rate 03/02/18 1600 (!) 101     Resp 03/02/18 1600 20     Temp 03/02/18 1600 98.6 F (37 C)     Temp Source 03/02/18 1600 Oral     SpO2 03/02/18 1600 94 %     Weight --      Height --      Head Circumference --      Peak Flow --      Pain Score 03/02/18 1602 8     Pain Loc --      Pain Edu? --      Excl.  in GC? --    No data found.  Updated Vital Signs BP 100/70 (BP Location: Right Arm)   Pulse (!) 101   Temp 98.6 F (37 C) (Oral)   Resp 20   SpO2 94%   Visual Acuity Right Eye Distance:   Left Eye Distance:   Bilateral Distance:    Right Eye Near:   Left Eye Near:    Bilateral Near:     Physical Exam Vitals signs and nursing note reviewed.  Constitutional:      General: He is not in acute distress.    Appearance: He is well-developed. He is obese. He is not ill-appearing, toxic-appearing or diaphoretic.  HENT:     Head: Normocephalic and atraumatic.  Cardiovascular:     Rate and Rhythm: Normal rate and regular rhythm.     Heart sounds: Normal heart sounds.  Pulmonary:     Effort: Pulmonary effort is normal.     Breath sounds: Normal breath sounds.  Abdominal:     General: Abdomen is protuberant. Bowel sounds are normal. There is no abdominal bruit.     Palpations: Abdomen is soft. There is no shifting dullness, fluid wave,  hepatomegaly, splenomegaly, mass or pulsatile mass.     Tenderness: There is generalized abdominal tenderness and tenderness in the epigastric area. There is no guarding. Negative signs include Murphy's sign.     Hernia: No hernia is present.     Comments: Generalized abdominal tenderness More tender in the upper abdominal area and epigastric area.  No rebound tenderness or masses.  Skin:    General: Skin is warm and dry.     Findings: No rash.  Neurological:     Mental Status: He is alert.  Psychiatric:        Mood and Affect: Mood normal.      UC Treatments / Results  Labs (all labs ordered are listed, but only abnormal results are displayed) Labs Reviewed - No data to display  EKG None  Radiology Dg Abd 2 Views  Result Date: 03/02/2018 CLINICAL DATA:  Initial evaluation for acute generalized abdominal pain, constipation. EXAM: ABDOMEN - 2 VIEW COMPARISON:  Prior radiograph from 03/01/2017. FINDINGS: Bowel gas pattern within normal limits without obstruction or ileus. No abnormal bowel wall thickening. No free air. Moderate retained stool within the colon, most notable at the splenic and hepatic flexures. No soft tissue mass or abnormal calcification. Visualized lung bases are clear. Mild degenerative changes noted within lower lumbar spine. IMPRESSION: 1. Nonobstructive bowel gas pattern. 2. Moderate retained stool within the colon, which could reflect constipation. Electronically Signed   By: Rise MuBenjamin  McClintock M.D.   On: 03/02/2018 17:07    Procedures Procedures (including critical care time)  Medications Ordered in UC Medications  alum & mag hydroxide-simeth (MAALOX/MYLANTA) 200-200-20 MG/5ML suspension 30 mL (30 mLs Oral Given 03/02/18 1721)  lidocaine (XYLOCAINE) 2 % viscous mouth solution 15 mL (15 mLs Oral Given 03/02/18 1721)    Initial Impression / Assessment and Plan / UC Course  I have reviewed the triage vital signs and the nursing notes.  Pertinent labs &  imaging results that were available during my care of the patient were reviewed by me and considered in my medical decision making (see chart for details).     X-ray revealed moderate amount of stool in the colon. We will treat for constipation with MiraLAX Instructed on how to use GI cocktail given in the clinic for acid reflux Started pt back on omeprazole  daily.  Educated on triggers for GERD and what to avoid. Pt understanding Instructed to follow up for continued symptoms.  He may needs GI follow up if symptoms continue    Final Clinical Impressions(s) / UC Diagnoses   Final diagnoses:  Constipation, unspecified constipation type     Discharge Instructions     Your x-ray showed a moderate amount of stool in the colon We will go ahead and treat you for constipation You can do 1 capful of MiraLAX twice a day until you have a good bowel movement You can then back off to once daily or once every couple of days I am also given you a GI cocktail here in clinic for the acid reflux You can continue with the omeprazole daily Make sure that you take the omeprazole 30- 60 minutes prior to a meal with a glass of water.  Avoid spicy, greasy foods, caffeine, chocolate and milk products.  No eating 2-3 hours before bedtime. Elevate the head of the bed 30 degrees.  Try this for a few weeks to see if this improves your symptoms.  If you don't see any improvement or your symptoms worsen please follow up with a GI      ED Prescriptions    Medication Sig Dispense Auth. Provider   polyethylene glycol powder (GLYCOLAX/MIRALAX) powder Take 255 g by mouth once for 1 dose. 255 g Maxmilian Trostel A, NP   omeprazole (PRILOSEC) 20 MG capsule Take 1 capsule (20 mg total) by mouth 2 (two) times daily before a meal. 60 capsule Jolette Lana, TraciDahlia Byes A, NP     Controlled Substance Prescriptions [place] Controlled Substance Registry consulted? no   Janace ArisBast, Tyjon Bowen A, NP 03/02/18 2125

## 2018-03-02 NOTE — ED Triage Notes (Signed)
Pt presents with generalized abdominal pain and nausea. 

## 2019-05-19 IMAGING — DX DG ABDOMEN 2V
3 series · 3 of 3 positions shown · non-contrast
Comparison: Prior radiograph from 03/01/2017.

CLINICAL DATA: Initial evaluation for acute generalized abdominal
pain, constipation.

EXAM:
ABDOMEN - 2 VIEW

[abdomen erect]
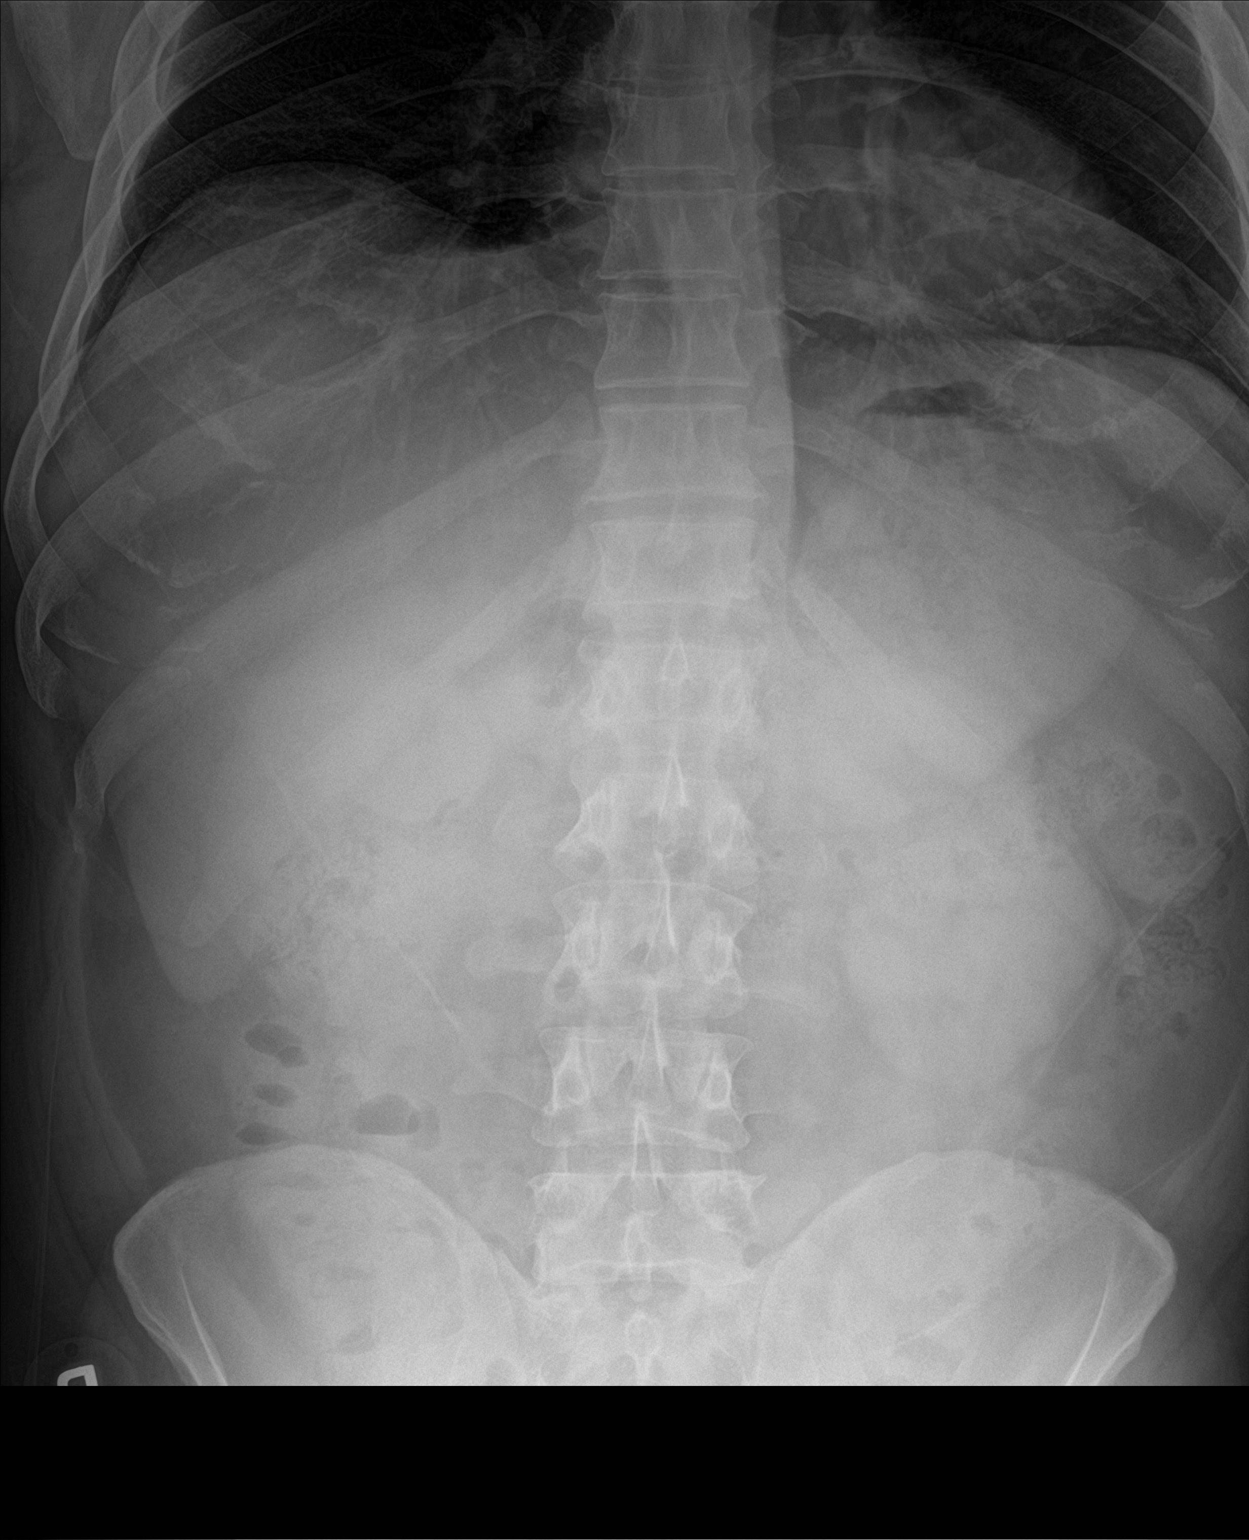

[abdomen supine (1 of 2)]
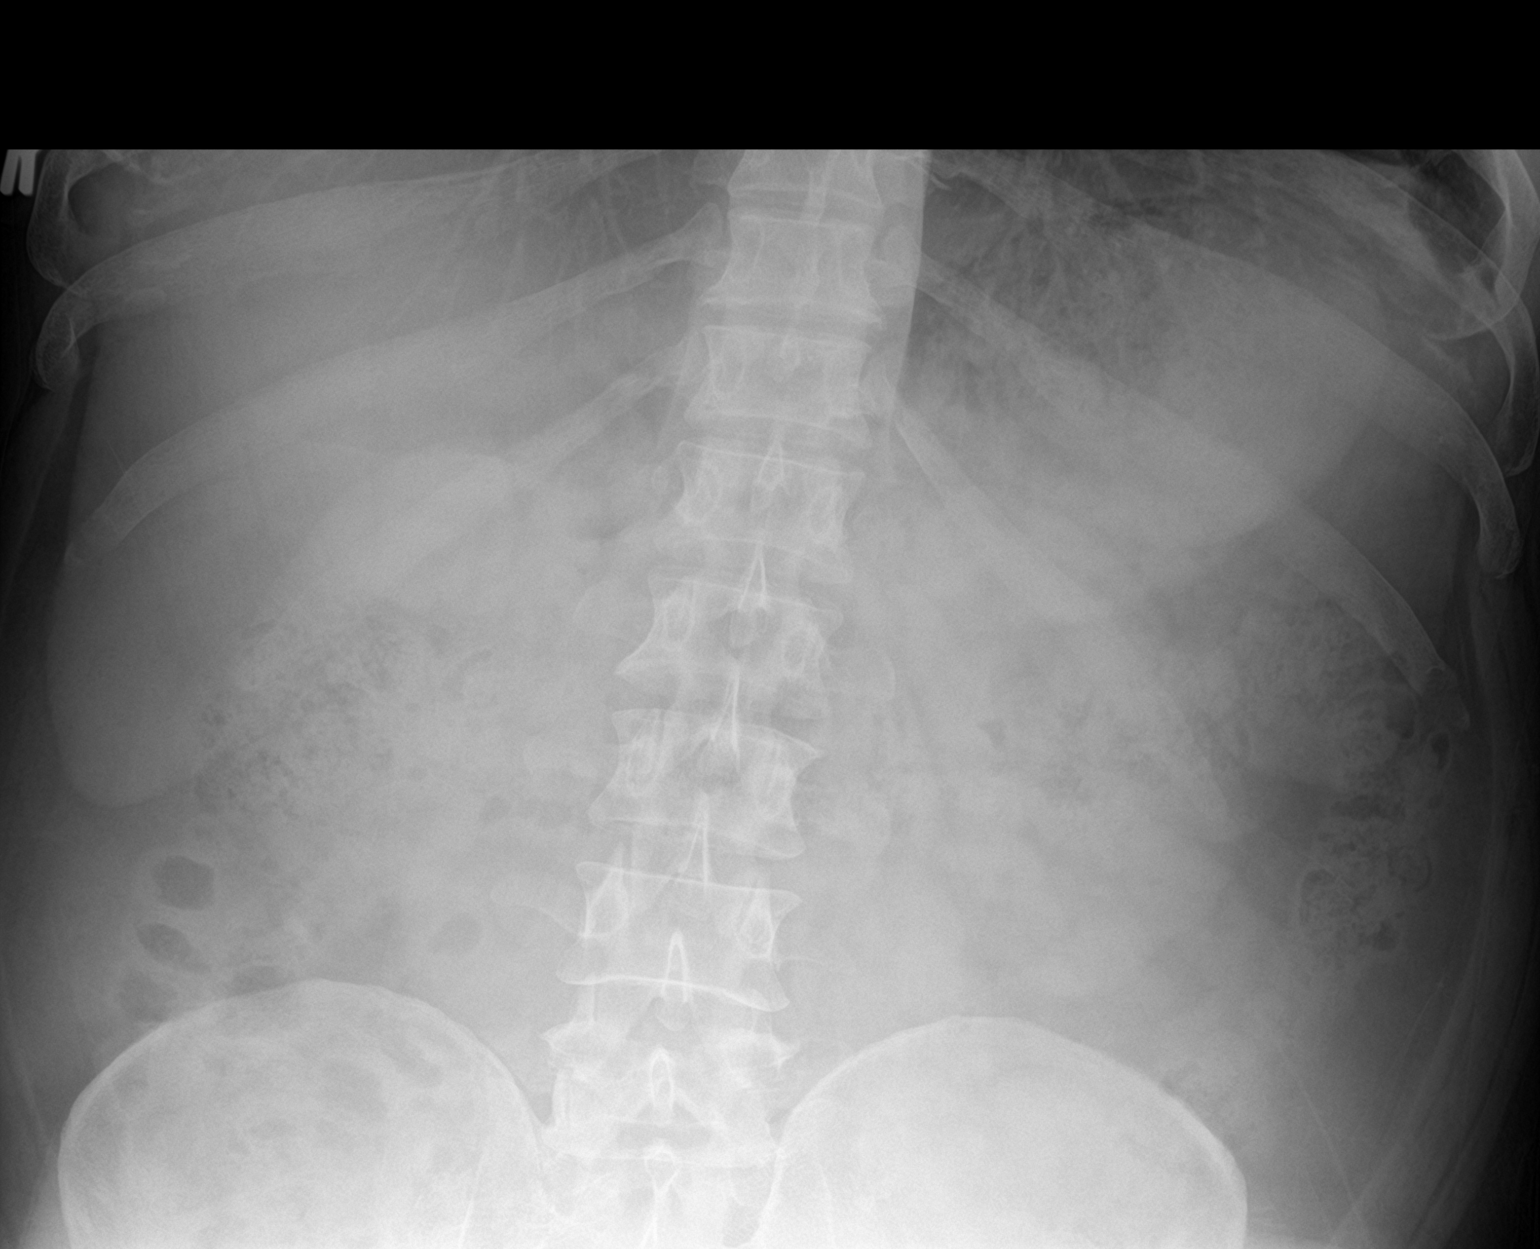

[abdomen supine (2 of 2)]
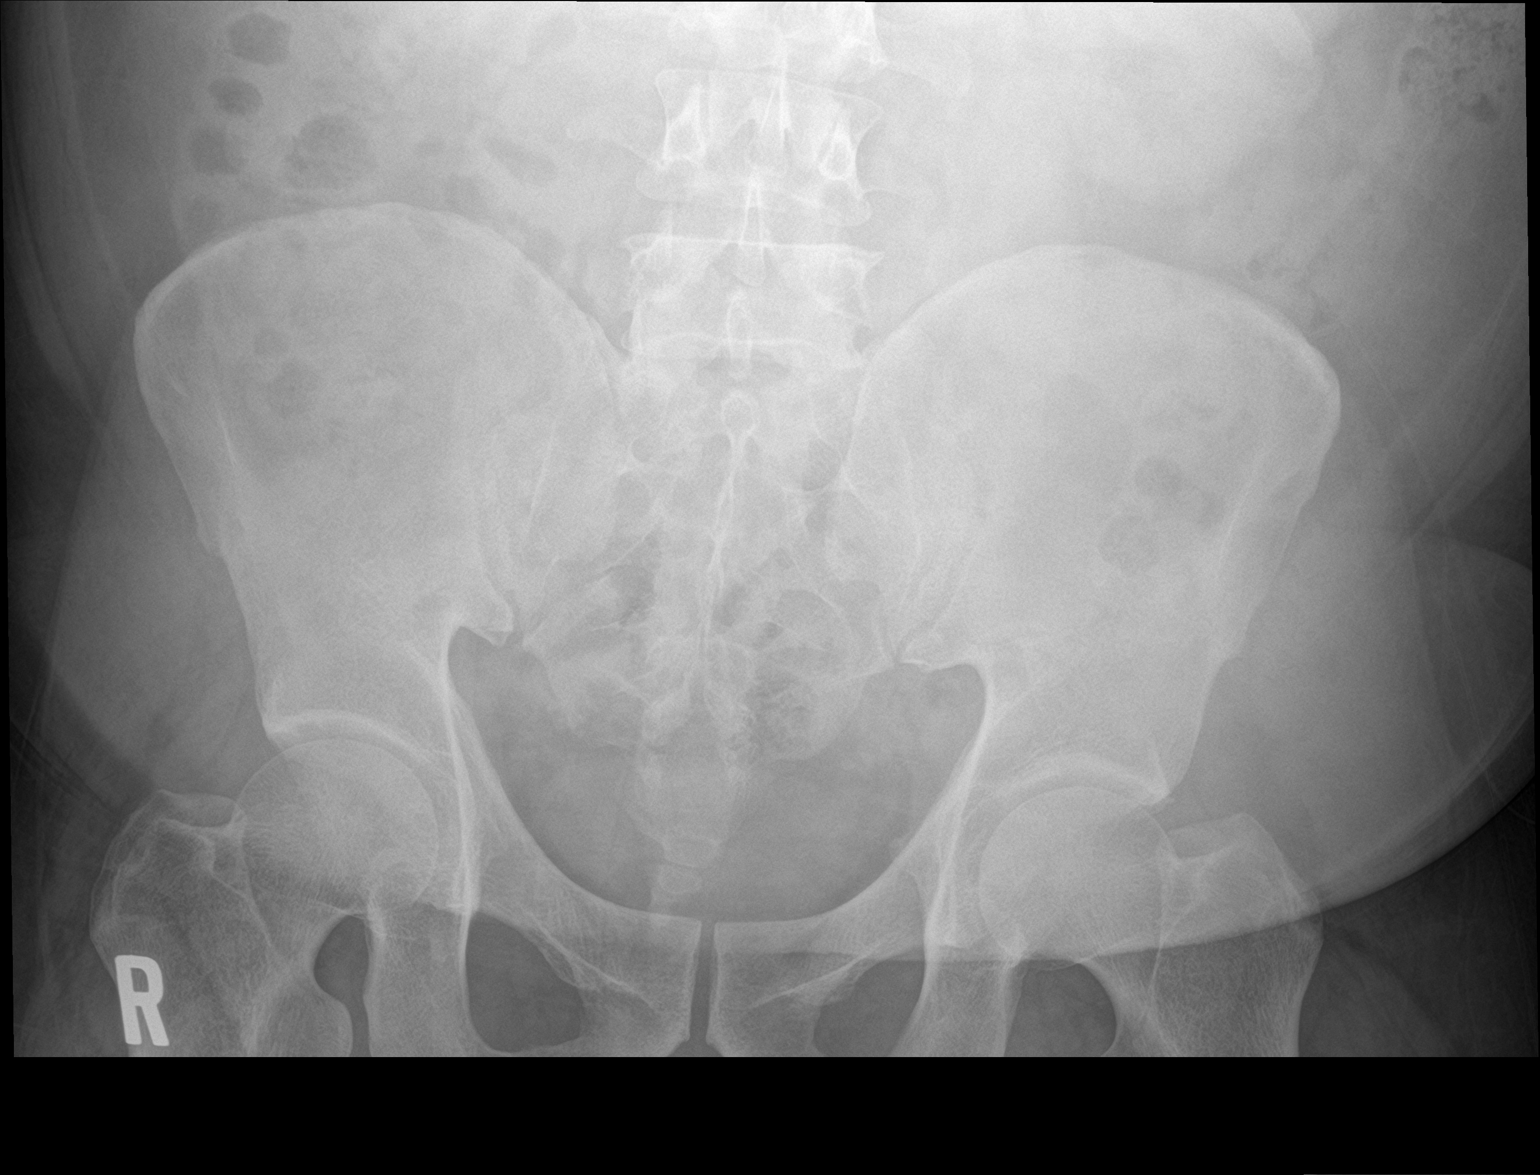

[3 of 3 positions shown; findings below may reference images not displayed]

FINDINGS: Bowel gas pattern within normal limits without obstruction or ileus.
No abnormal bowel wall thickening. No free air. Moderate retained
stool within the colon, most notable at the splenic and hepatic
flexures. No soft tissue mass or abnormal calcification.

Visualized lung bases are clear.

Mild degenerative changes noted within lower lumbar spine.
IMPRESSION: 1. Nonobstructive bowel gas pattern.
2. Moderate retained stool within the colon, which could reflect
constipation.

## 2020-10-03 ENCOUNTER — Encounter (INDEPENDENT_AMBULATORY_CARE_PROVIDER_SITE_OTHER): Payer: Self-pay

## 2020-10-03 ENCOUNTER — Encounter: Payer: Self-pay | Admitting: Family Medicine

## 2020-10-03 ENCOUNTER — Other Ambulatory Visit: Payer: Self-pay

## 2020-10-03 ENCOUNTER — Ambulatory Visit: Payer: Self-pay | Attending: Family Medicine | Admitting: Family Medicine

## 2020-10-03 VITALS — BP 129/81 | HR 75 | Ht 65.0 in | Wt 244.6 lb

## 2020-10-03 DIAGNOSIS — K2950 Unspecified chronic gastritis without bleeding: Secondary | ICD-10-CM

## 2020-10-03 MED ORDER — OMEPRAZOLE 20 MG PO CPDR
20.0000 mg | DELAYED_RELEASE_CAPSULE | Freq: Every day | ORAL | 3 refills | Status: AC
  Filled 2020-10-03: qty 30, 30d supply, fill #0

## 2020-10-03 NOTE — Progress Notes (Signed)
Having pain in upper abdomen. 

## 2020-10-03 NOTE — Progress Notes (Signed)
Subjective:  Patient ID: Jermaine Morgan, male    DOB: 1980-05-24  Age: 40 y.o. MRN: [id number]  CC: New Patient (Initial Visit)   HPI Jermaine Morgan is a 40 y.o. year old male with no significant past medical history who presents today to establish care.  Interval History: He complains of epigastric pain for the last 1 year worse after meals and not associated with nausea, vomiting, diarrhea, constipation. Spicy foods also worsen his symptoms. He has no excessive burping , hematochezia or melena. On some mornings he wakes up with an acidic liquid in his mouth. Past Medical History:  Diagnosis Date   Open pilon fracture    right    Past Surgical History:  Procedure Laterality Date   EXTERNAL FIXATION REMOVAL Right 12/29/2014   Procedure: REMOVAL EXTERNAL FIXATION RIGHT PILON FRACTURE;  Surgeon: Leandrew Koyanagi, MD;  Location: [place];  Service: Orthopedics;  Laterality: Right;   ORIF ANKLE FRACTURE Right 12/29/2014   Procedure: OPEN REDUCTION INTERNAL FIXATION (ORIF) RIGHT PILON FRACTURE;  Surgeon: Leandrew Koyanagi, MD;  Location: [place];  Service: Orthopedics;  Laterality: Right;   TIBIA IM NAIL INSERTION Right 12/17/2014   Procedure: INTRAMEDULLARY (IM) NAIL TIBIAL ,  I AND D TIBIA;  Surgeon: Leandrew Koyanagi, MD;  Location: [place];  Service: Orthopedics;  Laterality: Right;  Irrigation and debridement with external fixator placement of right open ankle injury     Family History  Problem Relation Age of Onset   Healthy Mother    Healthy Father     No Known Allergies  Outpatient Medications Prior to Visit  Medication Sig Dispense Refill   omeprazole (PRILOSEC) 20 MG capsule Take 1 capsule (20 mg total) by mouth 2 (two) times daily before a meal. (Patient not taking: Reported on 10/03/2020) 60 capsule 0   No facility-administered medications prior to visit.     ROS Review of Systems  Constitutional:  Negative for activity change and appetite change.  HENT:  Negative for sinus  pressure and sore throat.   Eyes:  Negative for visual disturbance.  Respiratory:  Negative for cough, chest tightness and shortness of breath.   Cardiovascular:  Negative for chest pain and leg swelling.  Gastrointestinal:  Positive for abdominal pain. Negative for abdominal distention, constipation and diarrhea.  Endocrine: Negative.   Genitourinary:  Negative for dysuria.  Musculoskeletal:  Negative for joint swelling and myalgias.  Skin:  Negative for rash.  Allergic/Immunologic: Negative.   Neurological:  Negative for weakness, light-headedness and numbness.  Psychiatric/Behavioral:  Negative for dysphoric mood and suicidal ideas.    Objective:  BP 129/81   Pulse 75   Ht '5\' 5"'  (1.651 m)   Wt 244 lb 9.6 oz (110.9 kg)   SpO2 97%   BMI 40.70 kg/m   BP/Weight 10/03/2020 03/02/2018 09/21/2334  Systolic BP 122 449 753  Diastolic BP 81 70 65  Wt. (Lbs) 244.6 - -  BMI 40.7 - -      Physical Exam Constitutional:      Appearance: He is well-developed.  Cardiovascular:     Rate and Rhythm: Normal rate.     Heart sounds: Normal heart sounds. No murmur heard. Pulmonary:     Effort: Pulmonary effort is normal.     Breath sounds: Normal breath sounds. No wheezing or rales.  Chest:     Chest wall: No tenderness.  Abdominal:     General: Bowel sounds are normal. There is no distension.     Palpations:  Abdomen is soft. There is no mass.     Tenderness: There is no abdominal tenderness.  Musculoskeletal:        General: Normal range of motion.     Right lower leg: No edema.     Left lower leg: No edema.  Neurological:     Mental Status: He is alert and oriented to person, place, and time.  Psychiatric:        Mood and Affect: Mood normal.    CMP Latest Ref Rng & Units 03/01/2017 12/20/2014 12/19/2014  Glucose 65 - 99 mg/dL 147(H) 110(H) 107(H)  BUN 6 - 20 mg/dL '12 12 9  ' Creatinine 0.61 - 1.24 mg/dL 0.64 0.65 0.70  Sodium 135 - 145 mmol/L 135 137 135  Potassium 3.5 - 5.1  mmol/L 4.0 3.4(L) 4.7  Chloride 101 - 111 mmol/L 103 99(L) 104  CO2 22 - 32 mmol/L 22 28 20(L)  Calcium 8.9 - 10.3 mg/dL 9.1 8.9 8.7(L)  Total Protein 6.5 - 8.1 g/dL 7.6 - -  Total Bilirubin 0.3 - 1.2 mg/dL 0.9 - -  Alkaline Phos 38 - 126 U/L 88 - -  AST 15 - 41 U/L 34 - -  ALT 17 - 63 U/L 39 - -    Lipid Panel  No results found for: CHOL, TRIG, HDL, CHOLHDL, VLDL, LDLCALC, LDLDIRECT  CBC    Component Value Date/Time   WBC 10.6 (H) 03/01/2017 0607   RBC 5.52 03/01/2017 0607   HGB 14.7 03/01/2017 0607   HCT 45.9 03/01/2017 0607   PLT 216 03/01/2017 0607   MCV 83.2 03/01/2017 0607   MCH 26.6 03/01/2017 0607   MCHC 32.0 03/01/2017 0607   RDW 13.1 03/01/2017 0607    No results found for: HGBA1C  Assessment & Plan:  1. Other chronic gastritis without hemorrhage Avoid recumbency up to 2 hours post meals Avoid late meals Will initiate PPI - omeprazole (PRILOSEC) 20 MG capsule; Take 1 capsule (20 mg total) by mouth daily.  Dispense: 30 capsule; Refill: 3 - CMP14+EGFR - CBC with Differential/Platelet    Meds ordered this encounter  Medications   omeprazole (PRILOSEC) 20 MG capsule    Sig: Take 1 capsule (20 mg total) by mouth daily.    Dispense:  30 capsule    Refill:  3    Follow-up: Return if symptoms worsen or fail to improve, for follow up of Gastritis.       Charlott Rakes, MD, FAAFP. [facility] [address], [place]   10/03/2020, 2:24 PM

## 2020-10-03 NOTE — Patient Instructions (Signed)
Gastritis - Adultos Gastritis, Adult La gastritis es la inflamacin del estmago. Hay dos tipos de gastritis: Gastritis aguda. Este tipo aparece de manera repentina. Gastritis crnica. Este tipo es mucho ms frecuente y Environmental consultant. La gastritis se manifiesta cuando el revestimiento del estmago se debilita o se lesiona. Sin tratamiento, la gastritis puede causar sangrado y Therapist, art. Cules son las causas? Esta afeccin puede ser causada por lo siguiente: Infeccin. Beber alcohol en exceso. Ciertos medicamentos. Estos incluyen corticoesteroides, antibiticos y algunos medicamentos de venta sin receta, como aspirina o ibuprofeno. Hay demasiado cido en el estmago. Una enfermedad del intestino o del estmago. Estrs. Una reaccin alrgica. Enfermedad de Crohn. Algunos tratamientos para el cncer (radiacin). A veces, se desconoce la causa de esta afeccin. Cules son los signos o los sntomas? Los sntomas de esta afeccin incluyen los siguientes: Dolor o sensacin de ardor en la parte superior del abdomen. Nuseas. Vmitos. Sensacin molesta de saciedad despus de comer. Prdida de peso. Mal aliento. Sangre en el vmito o las heces. En algunos casos, no hay sntomas. Cmo se diagnostica? Esta afeccin puede diagnosticarse en funcin de lo siguiente: Sus antecedentes mdicos y [place] descripcin de sus sntomas. Un examen fsico. Estudios. Estos pueden incluir los siguientes: Anlisis de [place]. Pruebas de heces. Un estudio en el que se introduce un instrumento delgado y flexible con Neomia Dear luz y Neomia Dear pequea cmara a travs del esfago hasta el estmago (endoscopa alta). Un estudio en el cual se toma una muestra de tejido para analizarlo (biopsia). Cmo se trata? Esta afeccin se puede tratar con medicamentos. Los medicamentos que se utilizan varan segn la causa de la gastritis: Si la afeccin se debe a una infeccin bacteriana, pueden recetarle medicamentos  antibiticos. Si la afeccin se debe al exceso de cido estomacal, se pueden administrar medicamentos denominados bloqueadores H2, inhibidores de la bomba de protones o anticidos. El tratamiento puede tambin incluir interrumpir el uso de ciertos medicamentoscomo aspirina, ibuprofeno u otros antiinflamatorios no esteroideos (AINE). Siga estas indicaciones en su casa: Medicamentos [employer] de venta libre y los recetados solamente como se lo haya indicado el mdico. Si le recetaron un antibitico, tmelo como se lo haya indicado el mdico. No deje de tomar el antibitico, aunque comience a Actor. Comida y bebida  Haga comidas pequeas y frecuentes durante el da en lugar de comidas abundantes. Evite los alimentos y las bebidas que intensifican los sntomas. Beba suficiente lquido como para Pharmacologist la orina de color amarillo plido.  Consumo de alcohol No beba alcohol si: Su mdico le indica no hacerlo. Est embarazada, puede estar embarazada o est tratando de [place]. Si bebe alcohol: Limite su uso a las siguientes medidas: De 0 a 1 medida por da para las mujeres. De 0 a 2 medidas por da para los hombres. Est atento a la cantidad de alcohol que hay en las bebidas que toma. En los [address], una medida equivale a una botella de cerveza de 12 oz (355 ml), un vaso de vino de 5 oz (148 ml) o un vaso de una bebida alcohlica de alta graduacin de 1 oz (44 ml). Indicaciones generales Hable con el mdico [employer] formas de controlar el estrs, Lexicographer ejercicio con regularidad o practicar respiracin profunda, meditacin o yoga. No consuma ningn producto que contenga nicotina o tabaco, como cigarrillos y Administrator, Civil Service. Si necesita ayuda para dejar de fumar, consulte al mdico. Concurra a todas las visitas de seguimiento como se lo haya indicado el mdico.  Esto es importante. Comunquese con un mdico si: Sus sntomas empeoran. Los  sntomas regresan despus del tratamiento. Solicite ayuda inmediatamente si: Vomita sangre de color rojo brillante o una sustancia similar a los granos de caf. Las heces son negras o de color rojo oscuro. No puede retener los lquidos. El dolor abdominal empeora. Tiene fiebre. No se siente mejor luego de [employer]. Resumen La gastritis es la inflamacin del revestimiento del estmago que puede ocurrir de Wellsite geologist repentina [place]) o desarrollarse lentamente con el tiempo (crnica). Esta afeccin se diagnostica mediante los antecedentes mdicos, un examen fsico o estudios. Esta afeccin puede tratarse con medicamentos para tratar la infeccin o medicamentos para reducir la cantidad de Pharmacologist. Siga las indicaciones del mdico acerca de tomar medicamentos, hacer cambios en la dieta y saber cundo pedir ayuda. Esta informacin no tiene Theme park manager el consejo del mdico. Asegresede hacerle al mdico cualquier pregunta que tenga. Document Revised: 08/14/2017 Document Reviewed: 08/14/2017 Elsevier Patient Education  2022 [employer].

## 2020-10-04 ENCOUNTER — Other Ambulatory Visit: Payer: Self-pay

## 2020-10-04 LAB — CBC WITH DIFFERENTIAL/PLATELET
Basophils Absolute: 0.1 10*3/uL (ref 0.0–0.2)
Basos: 1 %
EOS (ABSOLUTE): 0.2 10*3/uL (ref 0.0–0.4)
Eos: 2 %
Hematocrit: 46.5 % (ref 37.5–51.0)
Hemoglobin: 15.8 g/dL (ref 13.0–17.7)
Immature Grans (Abs): 0 10*3/uL (ref 0.0–0.1)
Immature Granulocytes: 0 %
Lymphocytes Absolute: 1.9 10*3/uL (ref 0.7–3.1)
Lymphs: 28 %
MCH: 27.4 pg (ref 26.6–33.0)
MCHC: 34 g/dL (ref 31.5–35.7)
MCV: 81 fL (ref 79–97)
Monocytes Absolute: 0.5 10*3/uL (ref 0.1–0.9)
Monocytes: 7 %
Neutrophils Absolute: 4.3 10*3/uL (ref 1.4–7.0)
Neutrophils: 62 %
Platelets: 212 10*3/uL (ref 150–450)
RBC: 5.76 x10E6/uL (ref 4.14–5.80)
RDW: 13.1 % (ref 11.6–15.4)
WBC: 6.9 10*3/uL (ref 3.4–10.8)

## 2020-10-04 LAB — CMP14+EGFR
ALT: 58 IU/L — ABNORMAL HIGH (ref 0–44)
AST: 35 IU/L (ref 0–40)
Albumin/Globulin Ratio: 1.4 (ref 1.2–2.2)
Albumin: 4.3 g/dL (ref 4.0–5.0)
Alkaline Phosphatase: 98 IU/L (ref 44–121)
BUN/Creatinine Ratio: 23 — ABNORMAL HIGH (ref 9–20)
BUN: 12 mg/dL (ref 6–24)
Bilirubin Total: 0.5 mg/dL (ref 0.0–1.2)
CO2: 24 mmol/L (ref 20–29)
Calcium: 9.2 mg/dL (ref 8.7–10.2)
Chloride: 104 mmol/L (ref 96–106)
Creatinine, Ser: 0.52 mg/dL — ABNORMAL LOW (ref 0.76–1.27)
Globulin, Total: 3 g/dL (ref 1.5–4.5)
Glucose: 100 mg/dL — ABNORMAL HIGH (ref 65–99)
Potassium: 4.4 mmol/L (ref 3.5–5.2)
Sodium: 142 mmol/L (ref 134–144)
Total Protein: 7.3 g/dL (ref 6.0–8.5)
eGFR: 130 mL/min/{1.73_m2} (ref >59)

## 2020-10-05 ENCOUNTER — Other Ambulatory Visit: Payer: Self-pay

## 2020-10-14 ENCOUNTER — Ambulatory Visit: Payer: Self-pay | Admitting: *Deleted

## 2020-10-14 ENCOUNTER — Telehealth: Payer: Self-pay

## 2020-10-14 NOTE — Telephone Encounter (Signed)
Patient 's wife called for patient's lab results and interpreter disconnected. Patient and wife were able to understand result note from Dr. Alvis Lemmings on 10/04/20 on 10/14/20 . Patient verbalized understanding lab results stable.

## 2020-10-14 NOTE — Telephone Encounter (Signed)
Pt states that he has been informed of lab results. 

## 2020-10-14 NOTE — Telephone Encounter (Signed)
Pt states that he has been informed of lab results.

## 2020-10-14 NOTE — Telephone Encounter (Signed)
-----   Message from Hoy Register, MD sent at 10/04/2020 10:28 AM EDT ----- Please inform the patient that labs are stable.

## 2020-10-14 NOTE — Telephone Encounter (Signed)
Copied from CRM [id number]. Topic: General - Call Back - No Documentation >> Oct 14, 2020 10:21 AM Crist Infante wrote: Reason for CRM: pt had labs done 8/15 and no one has called him .  Please call pt

## 2020-10-17 NOTE — Telephone Encounter (Signed)
Noted
